# Patient Record
Sex: Female | Born: 1960 | Race: White | Hispanic: No | Smoking: Current every day smoker
Health system: Western US, Academic
[De-identification: ages and names within clinical notes are randomized; demographics above are authoritative.]

## PROBLEM LIST (undated history)

## (undated) DIAGNOSIS — L309 Dermatitis, unspecified: Secondary | ICD-10-CM

## (undated) DIAGNOSIS — J449 Chronic obstructive pulmonary disease, unspecified: Secondary | ICD-10-CM

---
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
## 2012-09-23 ENCOUNTER — Ambulatory Visit: Payer: Self-pay | Admitting: Hematology and Oncology

## 2012-09-23 LAB — CBC CANCER CENTER
Basophil #: 0.1 x10 3/mm (ref 0.0–0.1)
Basophil %: 1.3 %
Eosinophil #: 0.3 x10 3/mm (ref 0.0–0.7)
Eosinophil %: 5.5 %
HCT: 45.2 % (ref 35.0–47.0)
Lymphocyte %: 23.7 %
MCHC: 33.8 g/dL (ref 32.0–36.0)
MCV: 90 fL (ref 80–100)
Monocyte #: 0.4 x10 3/mm (ref 0.2–0.9)
Neutrophil #: 3.9 x10 3/mm (ref 1.4–6.5)
Platelet: 194 x10 3/mm (ref 150–440)
RBC: 5.02 10*6/uL (ref 3.80–5.20)
RDW: 14.2 % (ref 11.5–14.5)
WBC: 6.2 x10 3/mm (ref 3.6–11.0)

## 2012-09-23 LAB — COMPREHENSIVE METABOLIC PANEL
Albumin: 3.8 g/dL (ref 3.4–5.0)
Alkaline Phosphatase: 94 U/L (ref 50–136)
Anion Gap: 8 (ref 7–16)
BUN: 11 mg/dL (ref 7–18)
Chloride: 101 mmol/L (ref 98–107)
Co2: 30 mmol/L (ref 21–32)
Creatinine: 0.99 mg/dL (ref 0.60–1.30)
EGFR (African American): >60
EGFR (Non-African Amer.): >60
Glucose: 93 mg/dL (ref 65–99)
Osmolality: 277 (ref 275–301)
Potassium: 3.6 mmol/L (ref 3.5–5.1)
SGPT (ALT): 53 U/L (ref 12–78)
Sodium: 139 mmol/L (ref 136–145)
Total Protein: 7.1 g/dL (ref 6.4–8.2)

## 2012-10-14 ENCOUNTER — Ambulatory Visit: Payer: Self-pay | Admitting: Hematology and Oncology

## 2013-02-05 ENCOUNTER — Emergency Department: Payer: Self-pay | Admitting: Emergency Medicine

## 2013-02-15 ENCOUNTER — Ambulatory Visit: Payer: Self-pay | Admitting: Gastroenterology

## 2013-02-18 LAB — PATHOLOGY REPORT

## 2013-05-02 ENCOUNTER — Inpatient Hospital Stay: Payer: Self-pay | Admitting: Family Medicine

## 2013-05-02 LAB — CBC
HCT: 40.7 % (ref 35.0–47.0)
HGB: 13.9 g/dL (ref 12.0–16.0)
MCH: 29.9 pg (ref 26.0–34.0)
MCHC: 34.1 g/dL (ref 32.0–36.0)
MCV: 88 fL (ref 80–100)
Platelet: 127 10*3/uL — ABNORMAL LOW (ref 150–440)
RBC: 4.65 10*6/uL (ref 3.80–5.20)
RDW: 13.4 % (ref 11.5–14.5)
WBC: 10.7 10*3/uL (ref 3.6–11.0)

## 2013-05-02 LAB — TROPONIN I: Troponin-I: <0.02 ng/mL

## 2013-05-02 LAB — BASIC METABOLIC PANEL
Anion Gap: 11 (ref 7–16)
BUN: 11 mg/dL (ref 7–18)
CALCIUM: 8.3 mg/dL — AB (ref 8.5–10.1)
CO2: 25 mmol/L (ref 21–32)
Chloride: 92 mmol/L — ABNORMAL LOW (ref 98–107)
Creatinine: 0.94 mg/dL (ref 0.60–1.30)
Glucose: 107 mg/dL — ABNORMAL HIGH (ref 65–99)
Osmolality: 257 (ref 275–301)
POTASSIUM: 2.6 mmol/L — AB (ref 3.5–5.1)
Sodium: 128 mmol/L — ABNORMAL LOW (ref 136–145)

## 2013-05-02 LAB — URINALYSIS, COMPLETE
Bilirubin,UR: NEGATIVE
GLUCOSE, UR: NEGATIVE mg/dL (ref 0–75)
NITRITE: NEGATIVE
Ph: 6 (ref 4.5–8.0)
RBC,UR: 6 /HPF (ref 0–5)
Specific Gravity: 1.012 (ref 1.003–1.030)
Squamous Epithelial: <1
WBC UR: 81 /HPF (ref 0–5)

## 2013-05-02 LAB — RAPID INFLUENZA A&B ANTIGENS

## 2013-05-03 LAB — CBC WITH DIFFERENTIAL/PLATELET
BASOS PCT: 0.2 %
Basophil #: 0 10*3/uL (ref 0.0–0.1)
EOS ABS: 0 10*3/uL (ref 0.0–0.7)
Eosinophil %: 0 %
HCT: 39.5 % (ref 35.0–47.0)
HGB: 13.3 g/dL (ref 12.0–16.0)
Lymphocyte #: 0.3 10*3/uL — ABNORMAL LOW (ref 1.0–3.6)
Lymphocyte %: 3.1 %
MCH: 30 pg (ref 26.0–34.0)
MCHC: 33.8 g/dL (ref 32.0–36.0)
MCV: 89 fL (ref 80–100)
MONO ABS: 0.3 x10 3/mm (ref 0.2–0.9)
Monocyte %: 2.9 %
NEUTROS ABS: 10 10*3/uL — AB (ref 1.4–6.5)
Neutrophil %: 93.8 %
Platelet: 125 10*3/uL — ABNORMAL LOW (ref 150–440)
RBC: 4.45 10*6/uL (ref 3.80–5.20)
RDW: 13.6 % (ref 11.5–14.5)
WBC: 10.6 10*3/uL (ref 3.6–11.0)

## 2013-05-03 LAB — BASIC METABOLIC PANEL
Anion Gap: 6 — ABNORMAL LOW (ref 7–16)
BUN: 10 mg/dL (ref 7–18)
CREATININE: 0.83 mg/dL (ref 0.60–1.30)
Calcium, Total: 8.4 mg/dL — ABNORMAL LOW (ref 8.5–10.1)
Chloride: 103 mmol/L (ref 98–107)
Co2: 27 mmol/L (ref 21–32)
EGFR (African American): >60
EGFR (Non-African Amer.): >60
Glucose: 197 mg/dL — ABNORMAL HIGH (ref 65–99)
OSMOLALITY: 276 (ref 275–301)
Potassium: 3.3 mmol/L — ABNORMAL LOW (ref 3.5–5.1)
Sodium: 136 mmol/L (ref 136–145)

## 2013-05-04 LAB — BASIC METABOLIC PANEL
ANION GAP: 4 — AB (ref 7–16)
BUN: 10 mg/dL (ref 7–18)
CO2: 25 mmol/L (ref 21–32)
Calcium, Total: 8.4 mg/dL — ABNORMAL LOW (ref 8.5–10.1)
Chloride: 110 mmol/L — ABNORMAL HIGH (ref 98–107)
Creatinine: 0.63 mg/dL (ref 0.60–1.30)
EGFR (African American): >60
GLUCOSE: 173 mg/dL — AB (ref 65–99)
Osmolality: 281 (ref 275–301)
POTASSIUM: 4.1 mmol/L (ref 3.5–5.1)
SODIUM: 139 mmol/L (ref 136–145)

## 2013-05-04 LAB — CBC WITH DIFFERENTIAL/PLATELET
Basophil #: 0 10*3/uL (ref 0.0–0.1)
Basophil %: 0.1 %
EOS PCT: 0 %
Eosinophil #: 0 10*3/uL (ref 0.0–0.7)
HCT: 35.8 % (ref 35.0–47.0)
HGB: 12.1 g/dL (ref 12.0–16.0)
LYMPHS PCT: 3.3 %
Lymphocyte #: 0.4 10*3/uL — ABNORMAL LOW (ref 1.0–3.6)
MCH: 30.2 pg (ref 26.0–34.0)
MCHC: 33.9 g/dL (ref 32.0–36.0)
MCV: 89 fL (ref 80–100)
Monocyte #: 0.4 x10 3/mm (ref 0.2–0.9)
Monocyte %: 3.1 %
NEUTROS PCT: 93.5 %
Neutrophil #: 10.9 10*3/uL — ABNORMAL HIGH (ref 1.4–6.5)
PLATELETS: 123 10*3/uL — AB (ref 150–440)
RBC: 4.02 10*6/uL (ref 3.80–5.20)
RDW: 13.7 % (ref 11.5–14.5)
WBC: 11.6 10*3/uL — AB (ref 3.6–11.0)

## 2013-05-05 LAB — URINE CULTURE

## 2013-05-07 LAB — CULTURE, BLOOD (SINGLE)

## 2013-10-28 ENCOUNTER — Emergency Department: Payer: Self-pay | Admitting: Emergency Medicine

## 2013-10-28 LAB — BASIC METABOLIC PANEL
Anion Gap: 7 (ref 7–16)
BUN: 10 mg/dL (ref 7–18)
CALCIUM: 8.5 mg/dL (ref 8.5–10.1)
Chloride: 106 mmol/L (ref 98–107)
Co2: 28 mmol/L (ref 21–32)
Creatinine: 0.82 mg/dL (ref 0.60–1.30)
EGFR (African American): >60
EGFR (Non-African Amer.): >60
GLUCOSE: 84 mg/dL (ref 65–99)
OSMOLALITY: 279 (ref 275–301)
Potassium: 3.6 mmol/L (ref 3.5–5.1)
Sodium: 141 mmol/L (ref 136–145)

## 2013-10-28 LAB — CBC
HCT: 42.9 % (ref 35.0–47.0)
HGB: 14.7 g/dL (ref 12.0–16.0)
MCH: 30.6 pg (ref 26.0–34.0)
MCHC: 34.3 g/dL (ref 32.0–36.0)
MCV: 89 fL (ref 80–100)
PLATELETS: 180 10*3/uL (ref 150–440)
RBC: 4.81 10*6/uL (ref 3.80–5.20)
RDW: 13.8 % (ref 11.5–14.5)
WBC: 4.9 10*3/uL (ref 3.6–11.0)

## 2013-10-28 LAB — URINALYSIS, COMPLETE
BACTERIA: NONE SEEN
Bilirubin,UR: NEGATIVE
Blood: NEGATIVE
Glucose,UR: NEGATIVE mg/dL (ref 0–75)
LEUKOCYTE ESTERASE: NEGATIVE
Nitrite: NEGATIVE
PH: 6 (ref 4.5–8.0)
Protein: NEGATIVE
RBC,UR: 2 /HPF (ref 0–5)
Specific Gravity: 1.008 (ref 1.003–1.030)
Squamous Epithelial: NONE SEEN
WBC UR: <1 /HPF (ref 0–5)

## 2013-10-28 LAB — TROPONIN I: Troponin-I: <0.02 ng/mL

## 2014-05-07 NOTE — H&P (Signed)
PATIENT NAME:  Toni Woods, Toni Woods MR#:  [id number] DATE OF BIRTH:  Aug 27, 1960  DATE OF ADMISSION:  05/02/2013  PRIMARY CARE PHYSICIAN: Nonlocal.  REFERRING PHYSICIAN: Dr. Governor Rooksebecca Lord.   CHIEF COMPLAINT: Cough, shortness of breath.   HISTORY OF PRESENT ILLNESS: The patient is a 54 year old female with history of continued tobacco use, hypertension, hyperlipidemia, presented to the Emergency Department with complaints of cough, shortness of breath for the last two days. Also noted to have a fever with chills. Concerning this, came to the Emergency Department. Work-up in the Emergency Department with CTA does not show very little airway thickening. The patient was also tachycardic. Concerning this, the decision is made to admit the patient for close monitoring.   PAST MEDICAL HISTORY: 1.  Anxiety. 2.  Hypertension.  3.  Chronic headaches.  4.  Polycythemia.  5.  Left renal artery stenosis.  6.  Left rotator cuff tear.  7.  Tubal ligation.    ALLERGIES: ANAPROX.  HOME MEDICATIONS: 1.  Trazodone 100 mg once a day.  2.  Simvastatin 20 mg once a day.  3.  Robaxin 500 mg 4 times a day.  4.  Prednisone 60 mg daily.  5.  Imdur 1 tablet once a day.  6.  Hydrochlorothiazide 25 mg once a day.  7.  Fluoxetine 20 mg once a day.  8.  Clonazepam 0.5 mg 3 times a day.  9.  Bupropion 10 mg 3 times a day.  10.  Aspirin 81 mg once a day.    SOCIAL HISTORY: Continues to smoke 1/2 pack to 1 pack a day. Denies drinking alcohol or using illicit drugs. 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   FAMILY HISTORY: Heart problems.   REVIEW OF SYSTEMS: CONSTITUTIONAL: Experiencing generalized weakness.  EYES: No change in vision.  ENT: No change in hearing.  RESPIRATORY: Has cough, shortness of breath.  GASTROINTESTINAL: No nausea, vomiting, abdominal pain.  GENITOURINARY: No dysuria or hematuria.  SKIN: No rash or lesions.  MUSCULOSKELETAL: No joint pains and aches.  NEUROLOGIC: No weakness or numbness in any part of the body.    PHYSICAL EXAMINATION: GENERAL: This patient is a 54 year old female who comes to the Emergency Department with cough, shortness of breath and fever, and is also found to have electrolyte imbalances of low potassium and low sodium.   ASSESSMENT AND PLAN:  1.  Chronic obstructive pulmonary disease exacerbation. Continue the breathing treatments, Solu-Medrol.  2.  Bronchitis. Keep the patient on antibiotics with Rocephin and Zithromax.  3.  Hyponatremia secondary to underlying acute lung disease, as well as dehydration. Continue with IV fluids and treat pneumonia.  4.  Hypokalemia: Will replace by mouth.  5.  Continued tobacco use. Counseled with patient for five minutes; expressed understanding.  6.  Hypertension, currently well-controlled. Continue with hydrochlorothiazide, Imdur. 7.  Keep the patient on deep vein thrombosis prophylaxis with Lovenox.   TIME SPENT: 50 minutes.    ____________________________ Susa GriffinsPadmaja Avry Monteleone, MD pv:cg D: 05/02/2013 23:05:00 ET T: 05/02/2013 23:32:59 ET JOB#: [id number]  cc: Susa GriffinsPadmaja Drisana Schweickert, MD, <Dictator> Susa GriffinsPADMAJA Athanasia Stanwood MD ELECTRONICALLY SIGNED 05/13/2013 20:56

## 2014-05-07 NOTE — Discharge Summary (Signed)
PATIENT NAME:  Toni Woods, [name] MR#:  [id number] DATE OF BIRTH:  19-Oct-1960  DATE OF ADMISSION:  05/02/2013 DATE OF DISCHARGE:  05/04/2013  CHIEF COMPLAINT: Shortness of breath.   HOSPITAL COURSE: The patient is a 54 year old female with history of tobacco abuse, hypertension, hyperlipidemia, anxiety. He presented to the Emergency Department with cough, hyperlipidemia. The patient was evaluated in the Emergency Department.  The patient had a CT angio that did not show any significant pulmonary embolism. It showed bronchitis. As far as her problems, the patient had COPD, had an exacerbation at this moment.  The patient was put on steroids and antibiotics, and improved in a couple of days. The patient has hypertension, which was well controlled during this hospitalization. Severe hypokalemia, this is likely due to her dietary status. The patient is doing much better after her airway has been cleared.   She is going to be discharged with the following medications:  Hydrochlorothiazide 25 mg once daily, trazodone 100 mg once daily, isosorbide mononitrate 30 mg once daily, buspirone 10 mg 3 times daily, clonazepam 0.5 mg 2 times daily, aspirin 81 mg daily, Robaxin 500 mg 4 times a day as needed for shortness of breath, fluoxetine 40 mg once a day, prednisone taper starting at 50 mg decreased by 1omg  every day until gone, Augmentin 875 mg/125 mg twice daily for 8 days, NicoDerm patch as needed for smoking cessation.   Follow up with primary care physician in the next 1 to 2 weeks.   HOSPITAL COURSE: A very nice 54 year old female with history of ongoing smoking, presents to the Emergency Department on 05/02/2013 due to acute shortness of breath for 2 days.  There were concerns on the Emergency Department as far as the possibility of pulmonary embolus, but her CT angio showed no pulmonary embolus. As far as her SOB  goes, the patient had chronic obstructive pulmonary disease with exacerbation/ pneumonia. The  patient treated with Solu-Medrol, antibiotics. Monitor As far as her hypertension, seems to be stable on her home medications.  As far as her hyponatremia, seems to be stable due to intravascular volume depletion, now resolved.    Continued tobacco abuse. The patient has been giving nicotine patches I spent about 40 minutes with this patient on discharging.   ____________________________ Felipa Furnaceoberto Sanchez Gutierrez, MD rsg:sg D: 05/09/2013 00:21:25 ET T: 05/09/2013 12:11:40 ET JOB#: [id number]  cc: Felipa Furnaceoberto Sanchez Gutierrez, MD, <Dictator> Ailey Wessling Juanda ChanceSANCHEZ GUTIERRE MD ELECTRONICALLY SIGNED 05/18/2013 23:05

## 2014-07-21 IMAGING — CT CT ANGIO CHEST
2 of 6 series · 18 of 36 positions shown · IV contrast (APPLIED)
Comparison: DG CHEST 2V dated 05/02/2013

CLINICAL DATA: Shortness of breath.  Cough.  Fever.

EXAM:
CT ANGIOGRAPHY CHEST WITH CONTRAST
TECHNIQUE: Multidetector CT imaging of the chest was performed using the
standard protocol during bolus administration of intravenous
contrast. Multiplanar CT image reconstructions and MIPs were
obtained to evaluate the vascular anatomy.
CONTRAST:  75 cc Isovue 370

[Series 5: pe 1.0 thins · axial · 0.67mm/px · z∈[-290,-45]mm · 17 of 277 slices shown]
[im 16/277  lung]
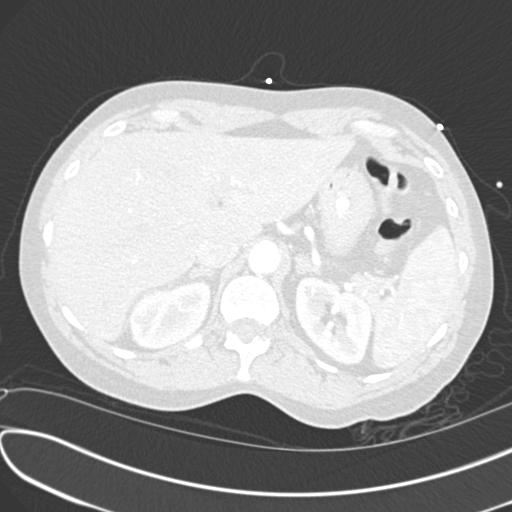
[im 31/277  mediastinal]
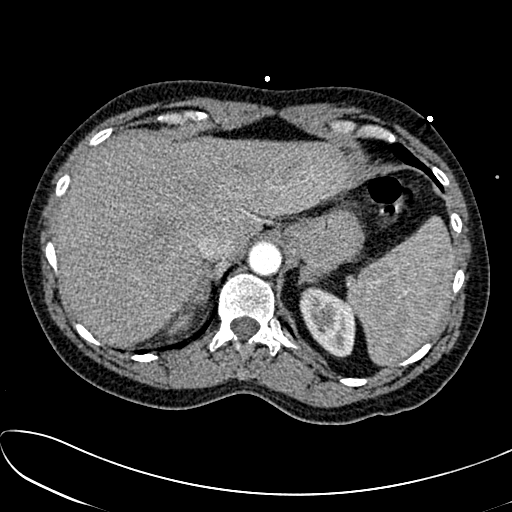
[im 47/277  lung]
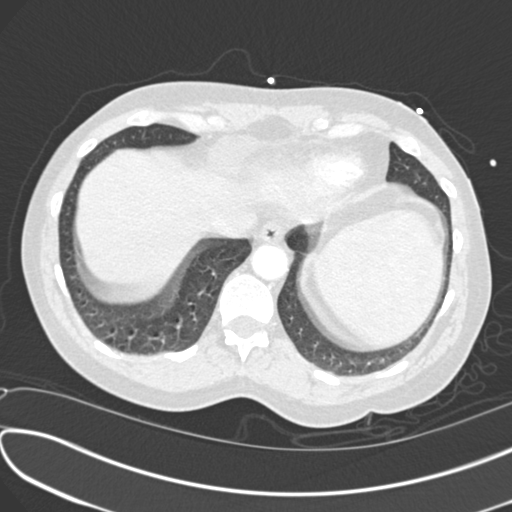
[im 62/277  mediastinal]
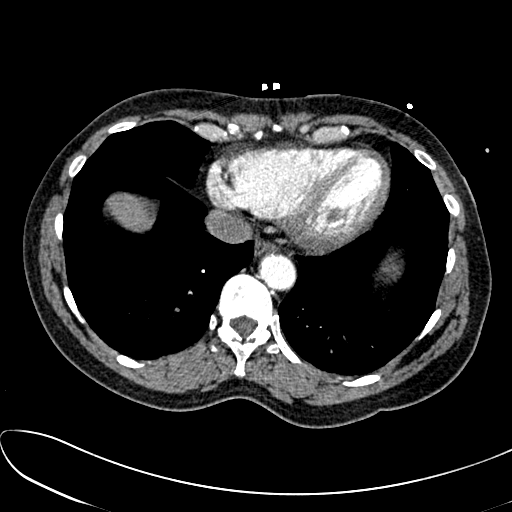
[im 77/277  lung]
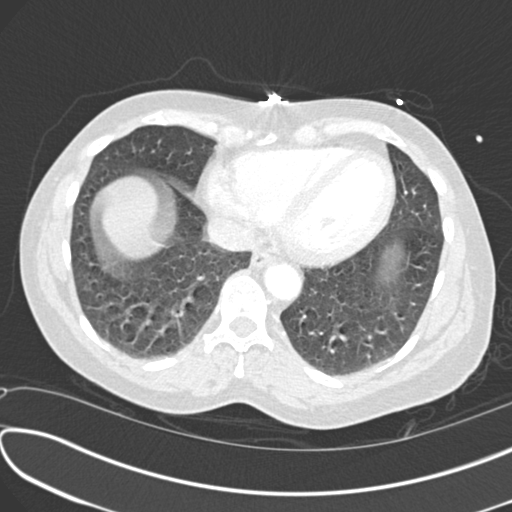
[im 93/277  mediastinal]
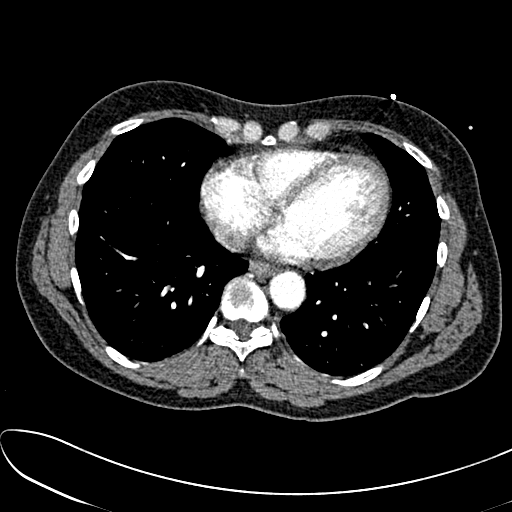
[im 108/277  lung]
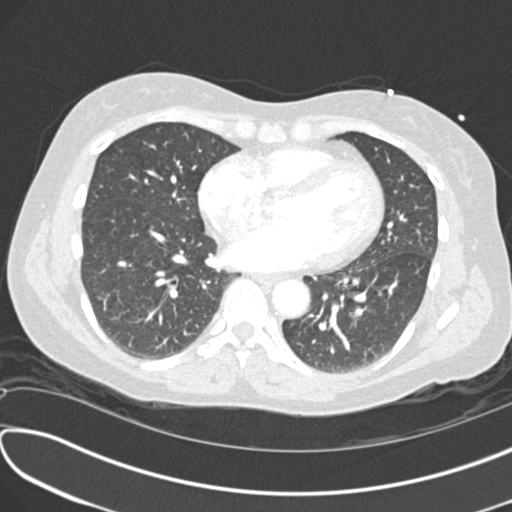
[im 123/277  mediastinal]
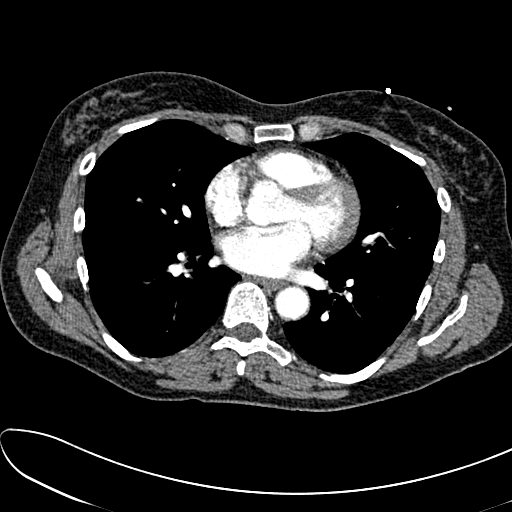
[im 139/277  lung]
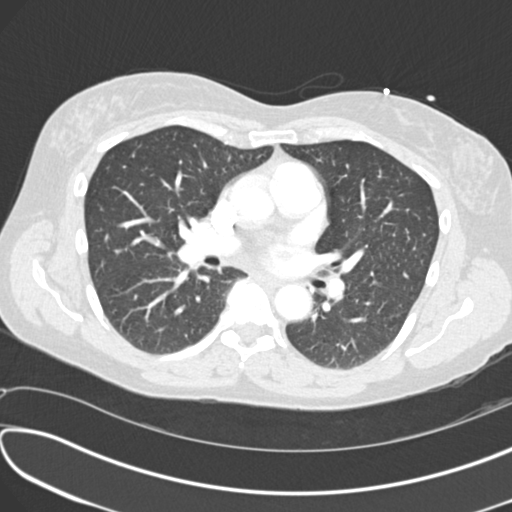
[im 154/277  mediastinal]
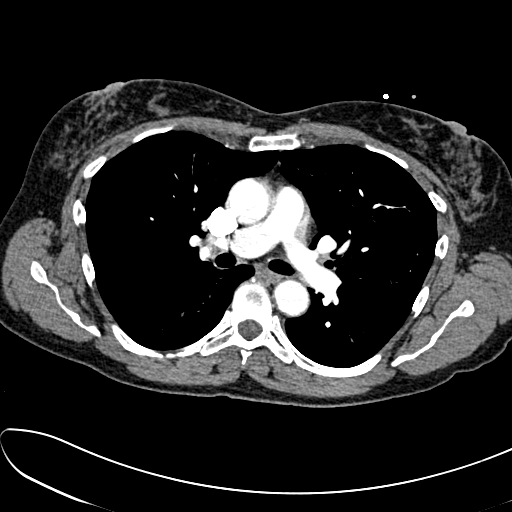
[im 169/277  lung]
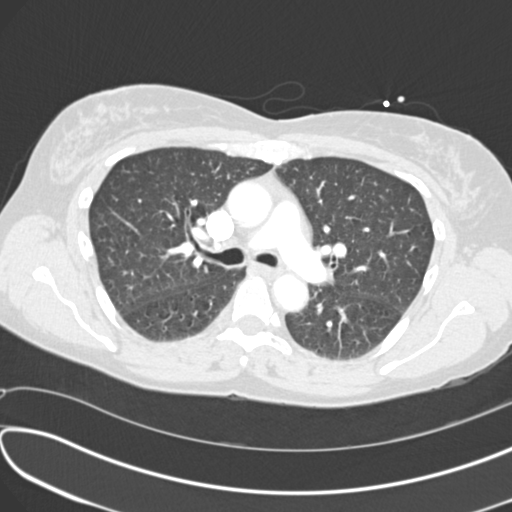
[im 185/277  mediastinal]
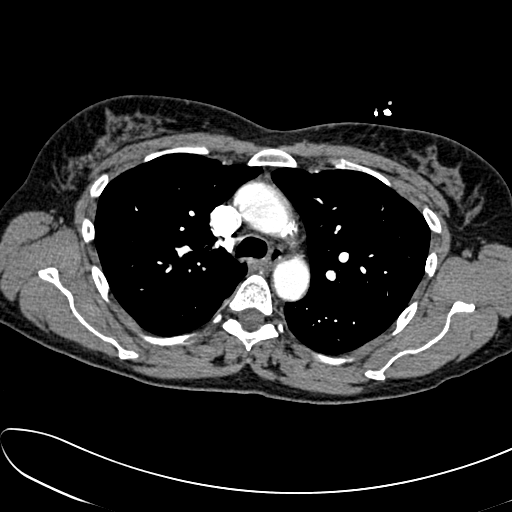
[im 200/277  lung]
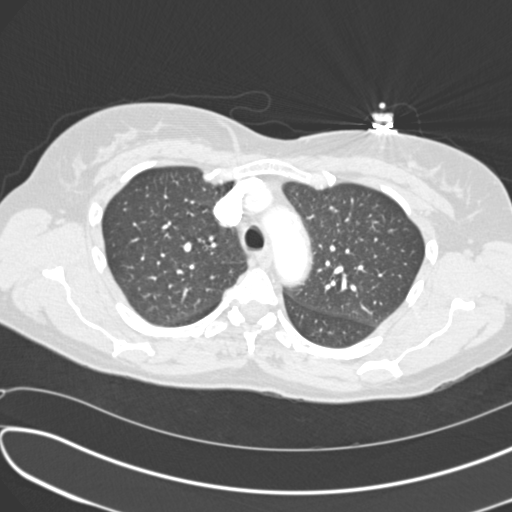
[im 215/277  mediastinal]
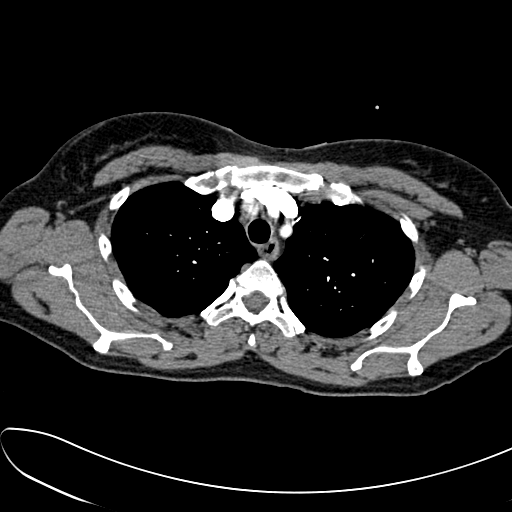
[im 231/277  lung]
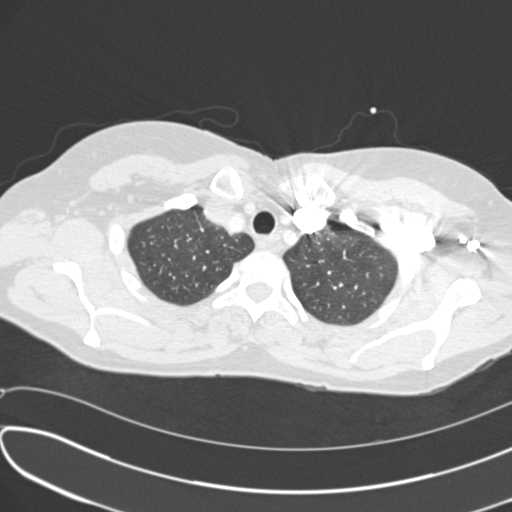
[im 246/277  mediastinal]
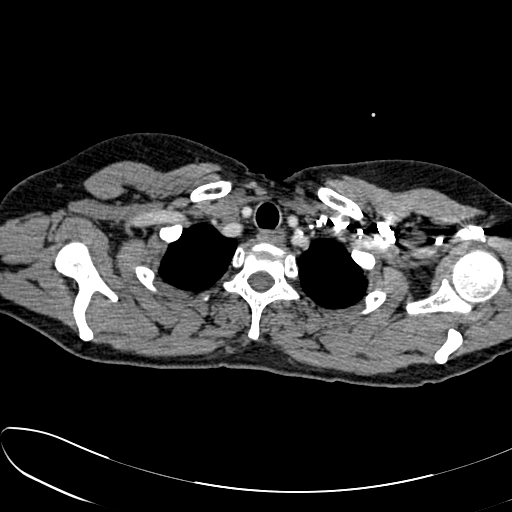
[im 261/277  lung]
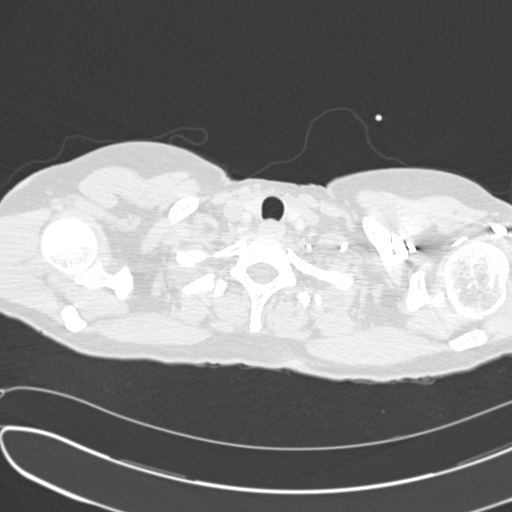

[Series 7: cor pe 2.0 mpr · coronal · 0.53mm/px · 1 of 102 slices shown]
[im 51/102  mediastinal]
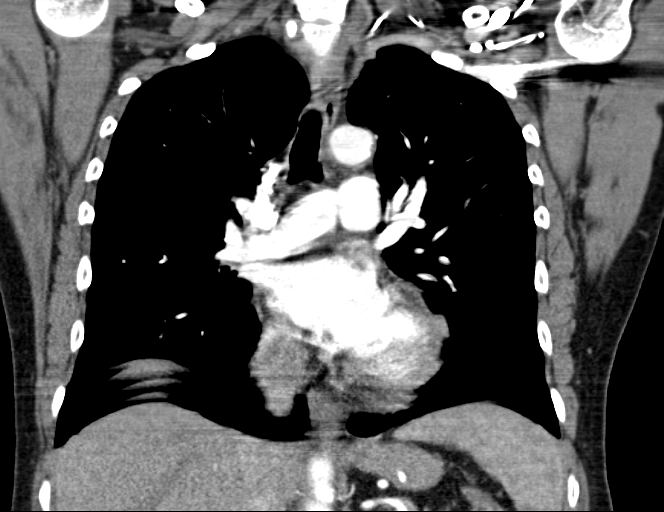

[18 of 36 positions shown; findings below may reference images not displayed]

FINDINGS: No filling defect is identified in the pulmonary arterial tree to
suggest pulmonary embolus. No acute aortic findings. No pathologic
thoracic adenopathy.

Fullness of both adrenal glands without overt mass visualized.

Mild bilateral airway thickening.

Review of the MIP images confirms the above findings.
IMPRESSION: 1. Bilateral airway thickening suggesting bronchitis or reactive
airways disease. No embolus identified.

## 2015-01-16 IMAGING — CR DG CHEST 2V
1 series · 2 of 2 positions shown · non-contrast
Comparison: CT of the chest May 02, 2013 and chest radiograph
May 02, 2013

CLINICAL DATA: RIGHT chest pain, history of COPD and smoking, heart
murmur. No additional acute clinical information provided.

EXAM:
CHEST  2 VIEW

[Series 1: dxr chest pa (or ap) and lateral · 0.14mm/px · 2 of 2 slices shown]
[im 1/2]
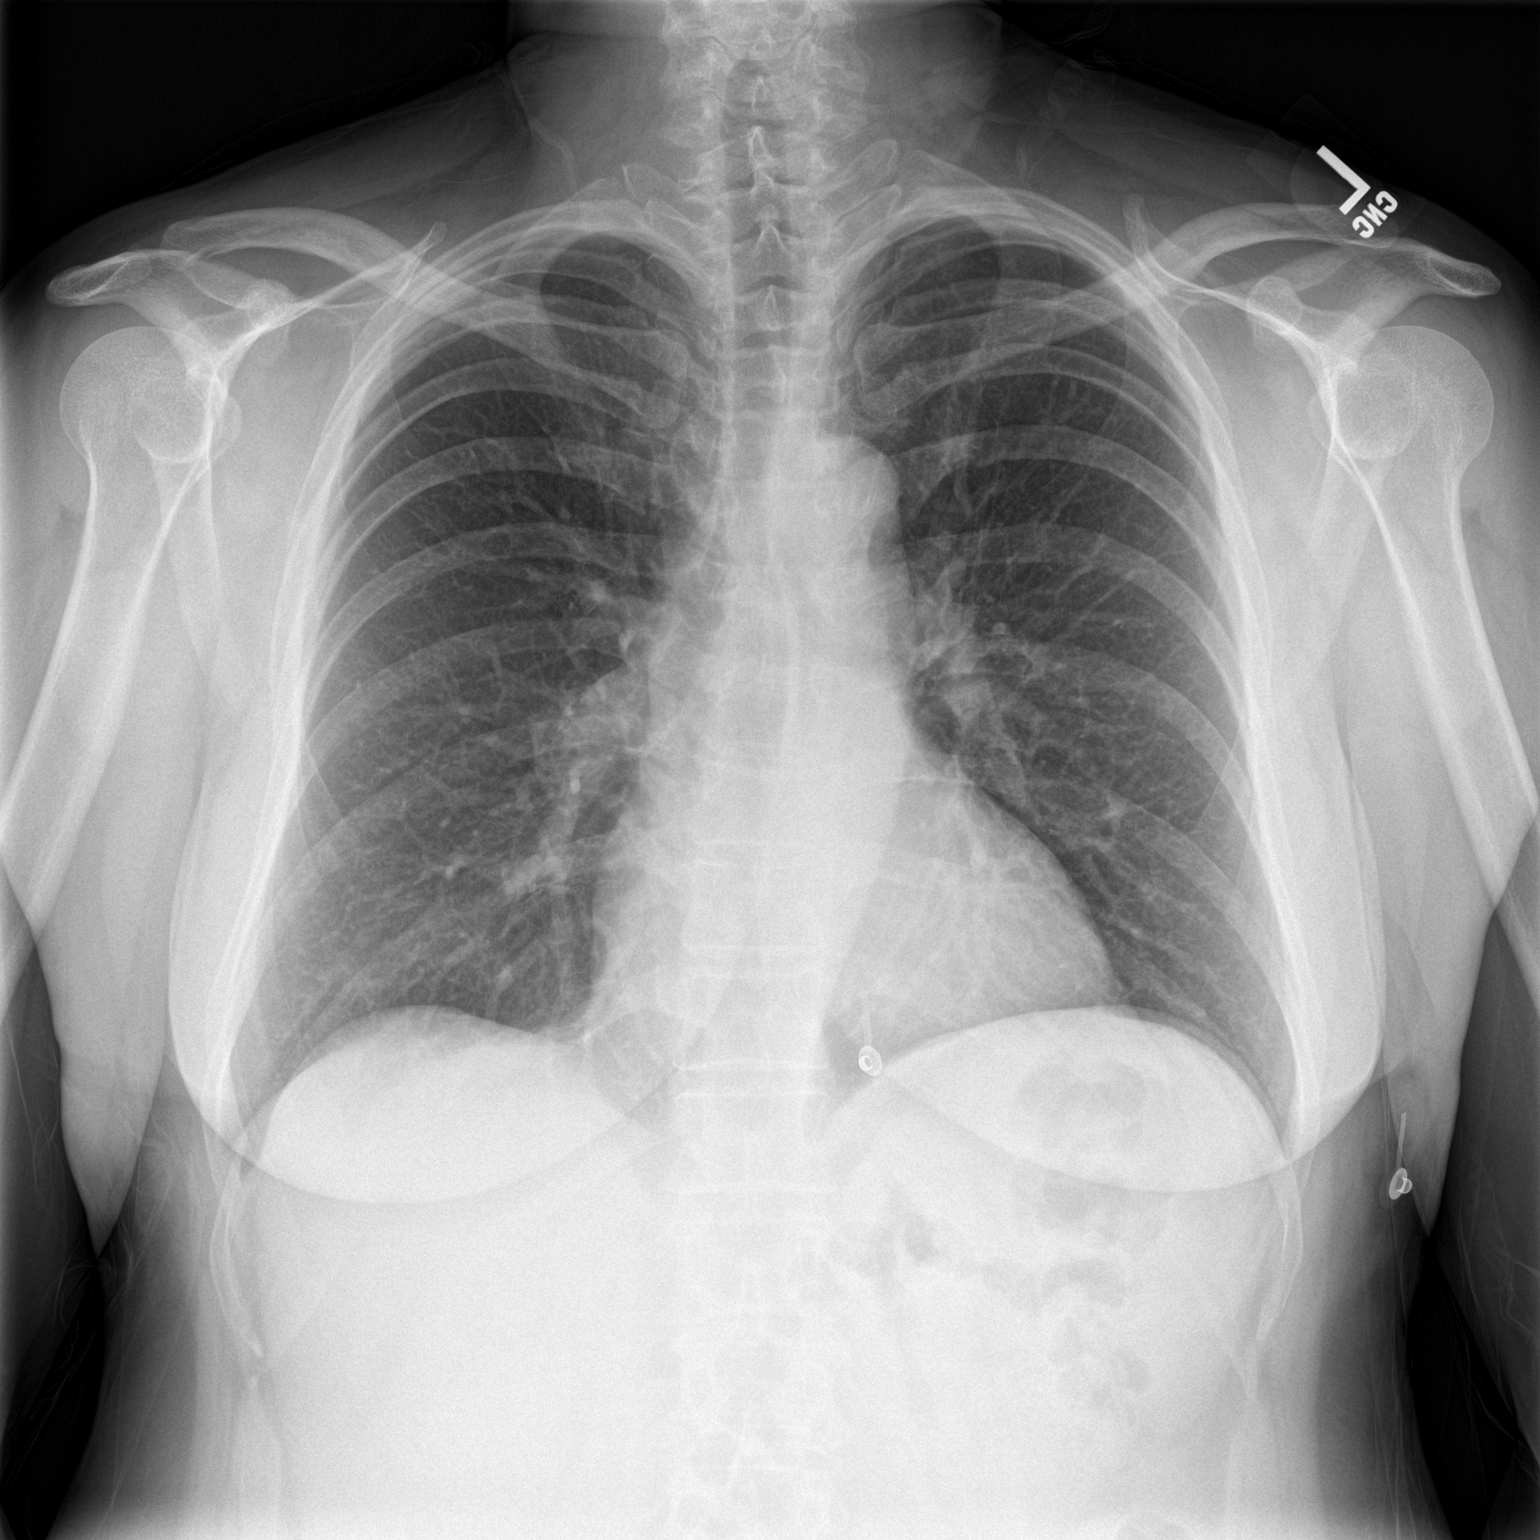
[im 2/2]
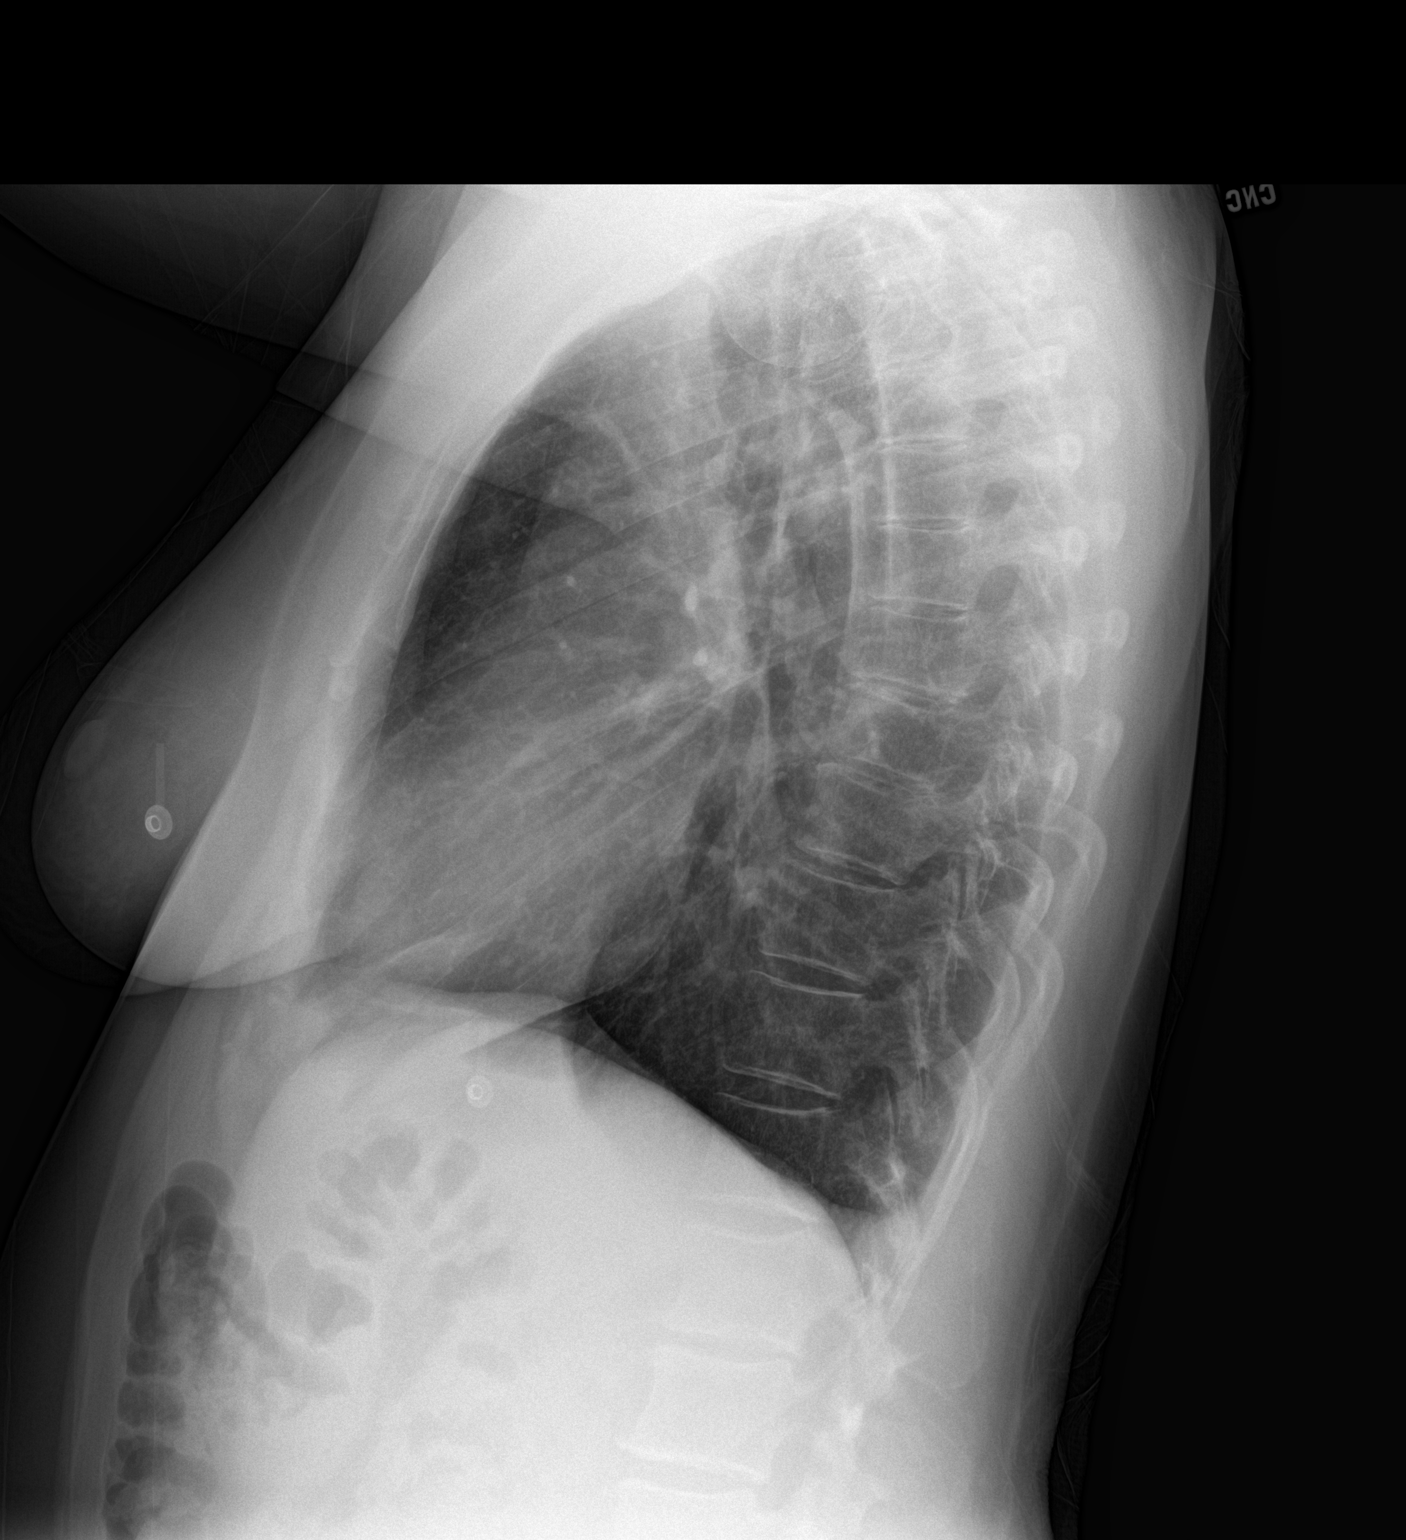

[2 of 2 positions shown; findings below may reference images not displayed]

FINDINGS: Cardiomediastinal silhouette is unremarkable. The lungs are clear
without pleural effusions or focal consolidations. Trachea projects
midline and there is no pneumothorax. Soft tissue planes and
included osseous structures are non-suspicious.
IMPRESSION: No active cardiopulmonary disease.

  By: Myrlande Krueger

## 2016-10-25 ENCOUNTER — Inpatient Hospital Stay
Admission: EM | Admit: 2016-10-25 | Discharge: 2016-10-26 | DRG: 203 | Disposition: A | Discharge status: Home / Self Care | Payer: MEDICAID | Attending: Internal Medicine | Admitting: Internal Medicine

## 2016-10-25 ENCOUNTER — Other Ambulatory Visit: Payer: MEDICAID

## 2016-10-25 ENCOUNTER — Emergency Department: Payer: MEDICAID

## 2016-10-25 DIAGNOSIS — E785 Hyperlipidemia, unspecified: Secondary | ICD-10-CM

## 2016-10-25 DIAGNOSIS — R079 Chest pain, unspecified: Secondary | ICD-10-CM

## 2016-10-25 DIAGNOSIS — I701 Atherosclerosis of renal artery: Secondary | ICD-10-CM

## 2016-10-25 DIAGNOSIS — I10 Essential (primary) hypertension: Secondary | ICD-10-CM

## 2016-10-25 DIAGNOSIS — F17210 Nicotine dependence, cigarettes, uncomplicated: Secondary | ICD-10-CM

## 2016-10-25 DIAGNOSIS — R0789 Other chest pain: Principal | ICD-10-CM

## 2016-10-25 HISTORY — DX: Chronic obstructive pulmonary disease, unspecified: J44.9

## 2016-10-25 HISTORY — DX: Dermatitis, unspecified: L30.9

## 2016-10-25 LAB — COMPREHENSIVE METABOLIC PANEL
ALANINE TRANSFERASE (ALT): 36 U/L (ref 4–56)
ALB/GLOB RATIO_MMC: 1.3 (ref 1.0–1.6)
ALBUMIN: 4.2 g/dL (ref 3.2–4.7)
ALKALINE PHOSPHATASE (ALP): 83 U/L (ref 38–126)
ASPARTATE TRANSAMINASE (AST): 27 U/L (ref 9–44)
BILIRUBIN TOTAL: 0.7 mg/dL (ref 0.1–2.2)
BUN/CREATININE_MMC: 14.8 (ref 7.3–21.7)
CALCIUM: 9.3 mg/dL (ref 8.7–10.2)
CARBON DIOXIDE TOTAL: 29 mmol/L (ref 22–32)
CHLORIDE: 104 mmol/L (ref 99–109)
CREATININE BLOOD: 0.81 mg/dL (ref 0.50–1.30)
E-GFR, NON-AFRICAN AMERICAN: 73 mL/min/{1.73_m2} (ref 60–?)
E-GFR_MMC: 73 mL/min/{1.73_m2} (ref 60–?)
GLOBULIN BLOOD_MMC: 3.3 g/dL (ref 2.2–4.2)
GLUCOSE: 99 mg/dL (ref 70–99)
POTASSIUM: 4.1 mmol/L (ref 3.5–5.2)
PROTEIN: 7.5 g/dL (ref 5.9–8.2)
SODIUM: 139 mmol/L (ref 134–143)
UREA NITROGEN, BLOOD (BUN): 12 mg/dL (ref 6–21)

## 2016-10-25 LAB — CK-MB PANEL (CK-ISO)
CK-MB_MMC: 1.6 ng/mL (ref <=4.0)
CK-MB_MMC: 1.4 ng/mL (ref <=4.0)
CREATINE KINASE, TOTAL_MMC: 75 U/L (ref 21–215)
CREATINE KINASE, TOTAL_MMC: 64 U/L (ref 21–215)
RELATIVE INDEX: 2.1 (ref 0.0–2.5)
RELATIVE INDEX: 2.2 (ref 0.0–2.5)

## 2016-10-25 LAB — CBC WITH DIFFERENTIAL
BASOPHILS % AUTO: 1 % (ref 0.0–1.0)
BASOPHILS ABS AUTO: 0.1 10*3/uL (ref 0.0–0.1)
EOSINOPHIL % AUTO: 5.4 % — AB (ref 0.0–4.0)
EOSINOPHIL ABS AUTO: 0.3 10*3/uL — AB (ref 0.0–0.2)
HEMATOCRIT: 42.1 % (ref 36.0–48.0)
HEMOGLOBIN: 14.5 g/dL (ref 12.0–16.0)
IMMATURE GRANULOCYTES % AUTO: 0.3 % (ref 0.00–0.50)
IMMATURE GRANULOCYTES ABS AUTO: 0 10*3/uL (ref 0.0–0.0)
LYMPHOCYTE ABS AUTO: 1.9 10*3/uL (ref 1.3–2.9)
LYMPHOCYTES % AUTO: 31 % (ref 5.0–41.0)
MCH: 30.2 pg (ref 27.0–34.0)
MCHC G/DL: 34.4 g/dL (ref 33.0–37.0)
MCV: 87.7 fL (ref 82.0–97.0)
MONOCYTES % AUTO: 6.2 % (ref 0.0–10.0)
MONOCYTES ABS AUTO: 0.4 10*3/uL (ref 0.3–0.8)
MPV: 10 fL (ref 9.4–12.4)
NEUTROPHIL ABS AUTO: 3.51 10*3/uL (ref 2.20–4.80)
NEUTROPHILS % AUTO: 56.1 % (ref 45.0–75.0)
NUCLEATED CELL COUNT: 0 10*3/uL (ref 0.0–0.1)
NUCLEATED RBC/100 WBC: 0 %{WBCs} (ref <=0.0)
PLATELET COUNT: 224 10*3/uL (ref 151–365)
RDW: 13.2 % (ref 11.5–14.5)
RED CELL COUNT: 4.8 10*6/uL (ref 3.80–5.10)
WHITE BLOOD CELL COUNT: 6.3 10*3/uL (ref 4.2–10.8)

## 2016-10-25 LAB — TROPONIN I

## 2016-10-25 LAB — TSH WITH FREE T4 REFLEX
THYROID STIMULATING HORMONE: 1.74 u[IU]/mL (ref 0.45–5.33)
THYROXINE, FREE (FREE T4): 1 ng/dL (ref 0.6–1.6)

## 2016-10-25 LAB — ELECTROCARDIOGRAM WITH RHYTHM STRIP: QTC: 442 ms

## 2016-10-25 MED ORDER — MORPHINE 4 MG/ML INTRAVENOUS CARTRIDGE
3.00 mg | CARTRIDGE | INTRAVENOUS | Status: DC | PRN
Start: 2016-10-25 — End: 2016-10-25

## 2016-10-25 MED ORDER — ALUMINUM-MAG HYDROXIDE-SIMETHICONE 200 MG-200 MG-20 MG/5 ML ORAL SUSP
30.00 mL | Freq: Once | ORAL | Status: AC
Start: 2016-10-25 — End: 2016-10-25
  Administered 2016-10-25: 30 mL via ORAL
  Filled 2016-10-25: qty 30

## 2016-10-25 MED ORDER — LIDOCAINE HCL 2 % MUCOSAL SOLUTION
10.00 mL | Freq: Once | Status: AC
Start: 2016-10-25 — End: 2016-10-25
  Administered 2016-10-25: 10 mL via ORAL
  Filled 2016-10-25: qty 15

## 2016-10-25 MED ORDER — NITROGLYCERIN 0.4 MG SUBLINGUAL TABLET
0.40 mg | SUBLINGUAL_TABLET | SUBLINGUAL | Status: DC
Start: 2016-10-25 — End: 2016-10-26

## 2016-10-25 MED ORDER — NITROGLYCERIN 0.4 MG SUBLINGUAL TABLET
0.40 mg | SUBLINGUAL_TABLET | SUBLINGUAL | Status: DC | PRN
Start: 2016-10-25 — End: 2016-10-26

## 2016-10-25 MED ORDER — BISACODYL 10 MG RECTAL SUPPOSITORY
10.00 mg | RECTAL | Status: DC | PRN
Start: 2016-10-25 — End: 2016-10-26

## 2016-10-25 MED ORDER — MELATONIN 3 MG TABLET
3.00 mg | ORAL_TABLET | Freq: Every day | ORAL | Status: DC | PRN
Start: 2016-10-25 — End: 2016-10-26

## 2016-10-25 MED ORDER — HYDROCODONE 5 MG-ACETAMINOPHEN 325 MG TABLET
1.00 | ORAL_TABLET | ORAL | Status: DC | PRN
Start: 2016-10-25 — End: 2016-10-26
  Administered 2016-10-25 – 2016-10-26 (×2): 1 via ORAL
  Filled 2016-10-25 (×2): qty 1

## 2016-10-25 MED ORDER — ASPIRIN 81 MG TABLET,DELAYED RELEASE
81.00 mg | DELAYED_RELEASE_TABLET | Freq: Every day | ORAL | Status: DC
Start: 2016-10-26 — End: 2016-10-26
  Administered 2016-10-26: 81 mg via ORAL
  Filled 2016-10-25: qty 1

## 2016-10-25 MED ORDER — DOCUSATE CALCIUM 240 MG CAPSULE
240.00 mg | ORAL_CAPSULE | Freq: Every day | ORAL | Status: DC
Start: 2016-10-25 — End: 2016-10-26

## 2016-10-25 MED ORDER — PNEUMOCOCCAL 23 POLYVALENT VACCINE 25 MCG/0.5 ML INJECTION SYRINGE
0.50 mL | INJECTION | Freq: Once | INTRAMUSCULAR | Status: AC
Start: 2016-10-25 — End: 2016-10-25
  Administered 2016-10-25: 0.5 mL via INTRAMUSCULAR
  Filled 2016-10-25: qty 0.5

## 2016-10-25 MED ORDER — MAGNESIUM HYDROXIDE 400 MG/5 ML ORAL SUSPENSION
30.00 mL | ORAL | Status: DC | PRN
Start: 2016-10-25 — End: 2016-10-26

## 2016-10-25 MED ORDER — LIDOCAINE HCL 10 MG/ML (1 %) INJECTION SOLUTION
0.20 mL | INTRAMUSCULAR | Status: DC | PRN
Start: 2016-10-25 — End: 2016-10-26
  Filled 2016-10-25: qty 10

## 2016-10-25 MED ORDER — SODIUM CHLORIDE 0.9 % (FLUSH) INJECTION SYRINGE
2.50 mL | INJECTION | INTRAMUSCULAR | Status: DC | PRN
Start: 2016-10-25 — End: 2016-10-26

## 2016-10-25 MED ORDER — ACETAMINOPHEN 325 MG TABLET
325.00 mg | ORAL_TABLET | ORAL | Status: DC | PRN
Start: 2016-10-25 — End: 2016-10-25

## 2016-10-25 MED ORDER — SODIUM CHLORIDE 0.9 % (FLUSH) INJECTION SYRINGE
2.50 mL | INJECTION | Freq: Three times a day (TID) | INTRAMUSCULAR | Status: DC
Start: 2016-10-25 — End: 2016-10-26
  Administered 2016-10-25 – 2016-10-26 (×4): 2.5 mL via INTRAVENOUS

## 2016-10-25 MED ORDER — SODIUM CHLORIDE 0.9 % (FLUSH) INJECTION SYRINGE
2.50 mL | INJECTION | INTRAMUSCULAR | Status: DC | PRN
Start: 2016-10-25 — End: 2016-10-25

## 2016-10-25 MED ORDER — NALOXONE 0.4 MG/ML INJECTION SOLUTION
0.08 mg | INTRAMUSCULAR | Status: DC | PRN
Start: 2016-10-25 — End: 2016-10-26

## 2016-10-25 MED ORDER — NALOXONE 0.4 MG/ML INJECTION SOLUTION
0.20 mg | INTRAMUSCULAR | Status: DC | PRN
Start: 2016-10-25 — End: 2016-10-26

## 2016-10-25 MED ORDER — MORPHINE 4 MG/ML INTRAVENOUS CARTRIDGE
3.00 mg | CARTRIDGE | INTRAVENOUS | Status: DC | PRN
Start: 2016-10-25 — End: 2016-10-26

## 2016-10-25 MED ORDER — FLU VACCINE QS 2018-19(6 MOS UP)(PF)60 MCG(15 MCGX4)/0.5 ML IM SYRINGE
0.50 mL | INJECTION | INTRAMUSCULAR | Status: AC
Start: 2016-10-25 — End: 2016-10-26
  Administered 2016-10-26: 0.5 mL via INTRAMUSCULAR
  Filled 2016-10-25: qty 0.5

## 2016-10-25 MED ORDER — PROMETHAZINE 25 MG/ML INJECTION SOLUTION
6.25 mg | INTRAMUSCULAR | Status: DC | PRN
Start: 2016-10-25 — End: 2016-10-26

## 2016-10-25 MED ORDER — PROMETHAZINE 25 MG/ML INJECTION SOLUTION
6.25 mg | INTRAMUSCULAR | Status: DC | PRN
Start: 2016-10-25 — End: 2016-10-25

## 2016-10-25 MED ORDER — ASPIRIN 81 MG CHEWABLE TABLET
162.00 mg | CHEWABLE_TABLET | Freq: Once | ORAL | Status: AC
Start: 2016-10-25 — End: 2016-10-25
  Administered 2016-10-25: 162 mg via ORAL
  Filled 2016-10-25: qty 2

## 2016-10-25 MED ORDER — ONDANSETRON HCL (PF) 4 MG/2 ML INJECTION SOLUTION
4.00 mg | Freq: Three times a day (TID) | INTRAMUSCULAR | Status: DC | PRN
Start: 2016-10-25 — End: 2016-10-26

## 2016-10-25 MED ORDER — SODIUM CHLORIDE 0.9 % (FLUSH) INJECTION SYRINGE
2.50 mL | INJECTION | Freq: Three times a day (TID) | INTRAMUSCULAR | Status: DC
Start: 2016-10-25 — End: 2016-10-25

## 2016-10-25 MED ORDER — HYDROCODONE 5 MG-ACETAMINOPHEN 325 MG TABLET
1.00 | ORAL_TABLET | ORAL | Status: DC | PRN
Start: 2016-10-25 — End: 2016-10-25

## 2016-10-25 MED ORDER — ACETAMINOPHEN 325 MG TABLET
325.00 mg | ORAL_TABLET | ORAL | Status: DC | PRN
Start: 2016-10-25 — End: 2016-10-26
  Administered 2016-10-25: 325 mg via ORAL
  Filled 2016-10-25: qty 1

## 2016-10-25 NOTE — ED Nursing Note (Signed)
Pt talking on phone in room, NAD.

## 2016-10-25 NOTE — ED Nursing Note (Signed)
Iv attempt LAC, got blood but iv no good.  unsuccesful

## 2016-10-25 NOTE — ED Triage Note (Signed)
Chest pain on and off for the past few weeks. Pain is increased today and is radiating to the left side. Describes as heaviness

## 2016-10-25 NOTE — Nurse Discharge Note (Signed)
Gave printout on stress test and echo.

## 2016-10-25 NOTE — ED Nursing Note (Signed)
Hospitalist bedside 

## 2016-10-25 NOTE — Nurse Focus (Signed)
End of Shift Statement      Patient Progress per diagnosis: A&Ox4. Pt reports slight pain in mid/left anterior chest 2/10 or less. Pt tolerating diet. Has been up walking the hallway and visiting her friend that is in 2042. No complaints. Pt given education on echo and stress test that will be done tomorrow. NSR    Significant events: none    Patients pain level assessed during shift and interventions for increased comfort were implemented as needed.   Interventions were effective  Patient was able to participate in ADL's , rest comfortably and increase mobility  Side Effects: none  Education provided on procedure stress test and echo    Unless otherwise noted, the patient's status remained unchanged during shift. All planned interventions and routine care were completed during the shift, and the patient/family received teaching in the language preferred with sufficient comprehension.  )    Treasa School, RN

## 2016-10-25 NOTE — H&P (Signed)
HISTORY & PHYSICAL  Service:  [facility] Hospitalist  Date of Admission:   10/25/2016 12:05 PM Patient's PCP: Celso Amy Moizeau   Note Started: 10/25/2016, 14:58 Date of Service:   10/25/16           CHIEF COMPLAINT  Chest pain    HISTORY OF PRESENT ILLNESS    Alice Baker is a pleasant 56 year old lady comes to the emergency room with complaining of chest pain.  Her symptoms started about a month ago.  The pain is located in the mid anterior sternal area.  This is intermittent in nature.  There is no relieving or exacerbating factor.  Then she went to the walk-in clinic and then follow-up with her primary care doctor.  Patient was thought to have a costochondritis and was given ibuprofen 800 mg which she took 3-4 times a day for the last 10 days.  Patient got somewhat better but continues to have ongoing intermittent chest pain.  Therefore patient was advised to check cardiac enzymes.  She came to the outpatient laboratory for CPK and troponin check and then she was advised to go to the emergency room.    Here in the emergency room EKG shows normal sinus rhythm no acute ischemic changes.  Blood pressure remained stable and patient is pain-free.  Patient does not have any diaphoretic spell, calf pain, pleuritic pain, fever, chills, nausea or vomiting, abdominal pain, skin rash or joint pain.  She is tolerating diet well and no symptoms suggestive of gallstones.  No fall trauma or injury.    The pain she refers a slightly got worse for the last 2 days and was radiating to the left chest and sometimes left arm.  Patient risk factor includes smoking.  Patient has cardiac catheterizations about 7 years ago.  She also has a risk factor in terms of hypertension and hyperlipidemia.  She was told to have renal artery stenosis as well.      ROS    Described in HPI, and rest are negative    Past Medical History:     1.  Hypertension  2.  Hyperlipidemia  3.  Left renal artery stenosis  4.  Active smoking      Past Surgical  History:    1.   Cardiac catheterization 7 years ago  2.  Left shoulder rotator cuff repair  3.  Right elbow tendon repair  4.  Laparoscopic cervical cyst excision in the past      Allergies:      Anaprox [Naproxen Sodium]    Chest Pain    Family History:    No premature artery disease, mother had multiple strokes    Social History:    Patient lives with a roommate, she is retired, smokes 1-3 cigarettes/day, drinks 1-2 glasses of wine per day    Prior to Admission Medications:      Prior to Admission medications    Not on File          VITAL SIGNS  Vital Signs Summary (Last Recorded)  BP (!) 160/105  Pulse 57  Temp 36 C (96.8 F)  Resp 16  Ht 1.626 m ( )  Wt 71.2 kg (157 lb)  SpO2 98%  BMI 26.95 kg/m2      PHYSICAL EXAM:  General Appearance: healthy, alert, no distress, pleasant affect, cooperative, skin warm, dry, and pink, cooperative.   HEENT: oral mucosa moist, No JVD, No cervical adenopathy, no [facility] rigidity  Heart: normal rate and regular rhythm, no murmurs,  clicks, or gallops.   Lungs: clear to auscultation.   Abdomen: soft and nontender .   Extremities: bilateral lower extremity no edema   Skin: no new rash .    Neuro: no focal deficit .   Mental Status: Appearance/Cooperation: oriented times 3.  Musculoskeletal: OA changes       LAB TESTS/STUDIES:    EKG does not show any acute ischemic changes normal sinus rhythm.  Chest x-ray is normal    Results for orders placed or performed during the hospital encounter of 10/25/16   CBC WITH DIFFERENTIAL   Result Value Ref Range    White Blood Cell Count 6.3 4.2 - 10.8 10*3/uL    Red Blood Cell Count 4.80 3.80 - 5.10 10*6/uL    Hemoglobin 14.5 12.0 - 16.0 g/dL    Hematocrit 60.4 54.0 - 48.0 %    MCV 87.7 82.0 - 97.0 fL    MCH 30.2 27.0 - 34.0 pg    MCHC 34.4 33.0 - 37.0 g/dL    RDW 98.1 19.1 - 47.8 %    MPV 10.0 9.4 - 12.4 fL    Platelet Count 224 151 - 365 10*3/uL    Neutrophils % Auto 56.1 45.0 - 75.0 %    Lymphocytes % Auto 31.0 5.0 - 41.0 %     Monocytes % Auto 6.2 0.0 - 10.0 %    Eosinophil % Auto 5.4 (H) 0.0 - 4.0 %    Basophils % Auto 1.0 0.0 - 1.0 %    Immature Granulocytes % 0.30 0.00 - 0.50 %    Neutrophil Abs Auto 3.51 2.20 - 4.80 10*3/uL    Lymphocyte Abs Auto 1.9 1.3 - 2.9 10*3/uL    Monocytes Abs Auto 0.4 0.3 - 0.8 10*3/uL    Eosinophil Abs Auto 0.3 (H) 0.0 - 0.2 10*3/uL    Basophils Abs Auto 0.1 0.0 - 0.1 10*3/uL    Nucleated RBC % Auto 0.0 <=0.0 % WBC    NRBC Abs Auto 0.0 0.0 - 0.1 10*3/uL    Immature Granulocytes Abs Auto 0.0 0.0 - 0.0 10*3/uL   COMPREHENSIVE METABOLIC PANEL   Result Value Ref Range    Sodium 139 134 - 143 mmol/L    Potassium 4.1 3.5 - 5.2 mmol/L    Chloride 104 99 - 109 mmol/L    Carbon Dioxide Total 29 22 - 32 mmol/L    Urea Nitrogen, Blood (BUN) 12 6 - 21 mg/dL    Glucose 99 70 - 99 mg/dL    Calcium 9.3 8.7 - 29.5 mg/dL    Protein 7.5 5.9 - 8.2 g/dL    Albumin 4.2 3.2 - 4.7 g/dL    Alkaline Phosphatase (ALP) 83 38 - 126 U/L    Aspartate Transaminase (AST) 27 9 - 44 U/L    Bilirubin Total 0.7 0.1 - 2.2 mg/dL    Alanine Transferase (ALT) 36 4 - 56 U/L    Creatinine Serum 0.81 0.50 - 1.30 mg/dL    Globulin 3.3 2.2 - 4.2 g/dL    Alb/Glob Ratio 1.3 1.0 - 1.6    BUN/ Creatinine 14.8 7.3 - 21.7    E-GFR 73 >60 mL/min/1.59m*2    E-GFR, Non-African American 73 >60 mL/min/1.62m*2   TROPONIN I   Result Value Ref Range    Troponin [facility] <0.01 <=0.03 ng/mL   ELECTROCARDIOGRAM WITH RHYTHM STRIP   Result Value Ref Range    QTC 442 ms         ASSESSMENT AND PLAN:  1.  Atypical chest pain, with some risk factor of hypertension, hyperlipidemia, some smoking  2.  Hypertension  3.  Hyperlipidemia  4.  Active smoking although significantly cut back    We will admit the patient to telemetry floor.  We will do serial cardiac enzymes 2D echo fasting lipid panel.  Will add the patient's on daily aspirin and continue to follow clinically.  Given the risk factors he would benefit from a stress test.  Given her age and ability to perform exercise  stress test that would be preferred choice at this point.  Order has been written to have exercise treadmill stress test tomorrow.    DVT Prophylaxis: SCD    Code status: Full code

## 2016-10-25 NOTE — ED Nursing Note (Signed)
[facility] bedside

## 2016-10-25 NOTE — Plan of Care (Signed)
Problem: Patient Care Overview (Adult)  Goal: Plan of Care Review  Outcome: Ongoing (interventions implemented as appropriate)   10/25/16 1822   OTHER   Plan Of Care Reviewed With patient   Plan of Care Review   Progress no change     Goal: Individualization and Mutuality  Outcome: Ongoing (interventions implemented as appropriate)    Goal: Discharge Needs Assessment  Outcome: Ongoing (interventions implemented as appropriate)   10/25/16 1822   Current Health   Outpatient/Agency/Support Group Needs none   Anticipated Changes Related to Illness none   Activity/Self Care Review of Systems   Equipment Currently Used at Home none   Living Environment   Transportation Available none       Problem: Pain, Acute (Adult)  Goal: Acceptable Pain Control/Comfort Level  Patient will demonstrate the desired outcomes by discharge/transition of care.  Outcome: Ongoing (interventions implemented as appropriate)   10/25/16 1822   Pain, Acute (Adult)   Acceptable Pain Control/Comfort Level making progress toward outcome

## 2016-10-25 NOTE — ED Nursing Note (Signed)
Pt transferred to N2 on gurney, on monitor, by my self, RN care handed off.

## 2016-10-25 NOTE — ED Initial Note (Signed)
Chief Complaint   Patient presents with    Chest Pain, Active Cardiac Features       HPI: Alice Baker is a 56yr female who presents with chest pain.  Patient states she has had chest pain intermittently over the past few weeks but it is increased today and radiating to her left side.  Patient describes her chest pain as a heaviness.  Patient went to her primary care physician previously and it was thought to be costochondritis.  Patient states she came to the lab to have a blood draw done but was referred to the emergency department.    Quality: heaviness  Location: chest  Severity: 6/10  Provoking Factors: none  Relieving Factors: none    Past Medical History:   Diagnosis Date    COPD (chronic obstructive pulmonary disease) (HCC)     Eczema 1962       No past surgical history on file.    No family history on file.    Social History     Social History    Marital status: WIDOWED     Spouse name: N/A    Number of children: N/A    Years of education: N/A     Occupational History    Not on file.     Social History Main Topics    Smoking status: Current Every Day Smoker     Packs/day: 0.20     Years: 40.00     Types: Cigarettes    Smokeless tobacco: Never Used      Comment: pt smokes 1-2 cigarettes a day    Alcohol use 4.2 oz/week     7 Glasses of wine per week      Comment: one 8 ounce glass of wine with dinner    Drug use: 1.00 per week     Special: Marijuana    Sexual activity: Not on file     Other Topics Concern    Not on file     Social History Narrative    No narrative on file       Allergies:   Anaprox [Naproxen Sodium]    Chest Pain    Medication:   Current Facility-Administered Medications:     Acetaminophen (TYLENOL) Tablet 325 mg, 325 mg, ORAL, Q4H PRN, Gaynell R Jenne, RPh    [START ON 10/26/2016] Aspirin EC Tablet 81 mg, 81 mg, ORAL, QAM, Prakash Dobaria    Bisacodyl (DULCOLAX) Suppository 10 mg, 10 mg, RECTALLY,  Q24H PRN, Joslyn Hy Dobaria    Docusate Calcium (COLACE) Capsule 240 mg, 240 mg, ORAL, QAM, Prakash Dobaria    Hydrocodone 5 mg/Acetaminophen 325 mg (NORCO  5) Tablet 1 tablet, 1 tablet, ORAL, Q4H PRN, Gaynell R Jenne, RPh    Influenza Vaccine, Quadrivalent (FLULAVAL/FLUARIX) Injection 0.5 mL, 0.5 mL, IM, WHEN APPROPRIATE, Prakash Dobaria    Lidocaine (XYLOCAINE) 10 mg/mL (1%) Injection 0.2 mL, 0.2 mL, INTRADERMAL, PRN, Ala Bent    Magnesium Hydroxide (MILK OF MAGNESIA) 400 mg/5 mL Suspension 30 mL, 30 mL, ORAL, Q24H PRN, Ala Bent    Melatonin Tablet 3 mg, 3 mg, ORAL, Bedtime PRN, Ala Bent    Morphine Injection 3 mg, 3 mg, IV, Q30MIN PRN, Gaynell R Jenne, RPh    Naloxone (NARCAN) Injection 0.08 mg, 0.08 mg, IV, Q2MIN PRN, Joslyn Hy Dobaria    Naloxone (NARCAN) Injection 0.2 mg, 0.2 mg, IV, PRN, Ala Bent    Nitroglycerin (NITROSTAT) Sublingual Tablet 0.4 mg, 0.4 mg, SUBLINGUAL, Q5MIN PRN, Ala Bent  Nitroglycerin (NITROSTAT) Sublingual Tablet 0.4 mg, 0.4 mg, SUBLINGUAL, Q5MIN, Carmelina Noun, MD    Ondansetron (ZOFRAN) Injection 4 mg, 4 mg, IV, Q8H PRN, Ala Bent    Promethazine (PHENERGAN) Injection 6.25 mg, 6.25 mg, IV, Q4H PRN, Gaynell R Jenne, RPh    Saline Lock Flush 2.5 mL, 2.5 mL, IV, Q8H (16,10,96), Ala Bent, 2.5 mL at 10/25/16 1707    Saline Lock Flush 2.5 mL, 2.5 mL, IV, PRN, Joslyn Hy Dobaria    ROS:  Review of Systems   Constitutional: Negative for activity change, chills, fatigue, fever and unexpected weight change.   HENT: Negative for congestion, dental problem, ear pain, facial swelling, mouth sores, postnasal drip, rhinorrhea, sinus pressure, sore throat and trouble swallowing.    Eyes: Negative for photophobia, pain, discharge, redness and visual disturbance.   Respiratory: Positive for chest tightness. Negative for cough, shortness of breath and wheezing.     Cardiovascular: Positive for chest pain. Negative for palpitations and leg swelling.   Gastrointestinal: Negative for abdominal pain, diarrhea, nausea and vomiting.   Endocrine: Negative for polydipsia and polyuria.   Genitourinary: Negative for decreased urine volume, dysuria, flank pain and hematuria.   Musculoskeletal: Negative for back pain, joint swelling, myalgias and neck pain.   Skin: Negative for rash.   Allergic/Immunologic: Negative for environmental allergies and food allergies.   Neurological: Negative for light-headedness, numbness and headaches.   Hematological: Negative for adenopathy.   Psychiatric/Behavioral: Negative for behavioral problems, confusion, hallucinations and self-injury. The patient is not nervous/anxious.        PE:  Physical Exam   Constitutional: She is oriented to person, place, and time. She appears well-developed and well-nourished.   HENT:   Head: Normocephalic and atraumatic.   Right Ear: External ear normal.   Left Ear: External ear normal.   Nose: Nose normal.   Mouth/Throat: Oropharynx is clear and moist. No oropharyngeal exudate.   Eyes: Conjunctivae and EOM are normal. Pupils are equal, round, and reactive to light. Left eye exhibits no discharge.   Neck: Normal range of motion. Neck supple.   Cardiovascular: Normal rate, regular rhythm, normal heart sounds and intact distal pulses.    Pulmonary/Chest: Effort normal and breath sounds normal. No respiratory distress. She has no wheezes. She exhibits tenderness.   Abdominal: Soft. Bowel sounds are normal. There is no tenderness.   Musculoskeletal: Normal range of motion. She exhibits no edema.   Neurological: She is alert and oriented to person, place, and time. She has normal reflexes.   Skin: Skin is warm and dry.   Psychiatric: She has a normal mood and affect. Her behavior is normal. Judgment and thought content normal.   Nursing note and vitals  reviewed.      Temp: 36 C (96.8 F) (10/12 1139)  Temp src: Tympanic (10/12 1538)  Pulse: 58 (10/12 1538)  BP: 159/96 (10/12 1538)  Resp: 18 (10/12 1538)  SpO2: 98 % (10/12 1538)  Height: 162.6 cm ( ) (10/12 1538)  Weight: 70.5 kg (155 lb 6.8 oz) (10/12 1538)    Differentials: This differential diagnosis is broad and complex, and includes, but is not limited to STEMI, NSTEMI, unstable angina, aortic dissection, pericardial tamponade, pericarditis, myocarditis, pulmonary embolism, tension pneumothorax, pneumonia, mediastinitis, gastroesophageal reflux disease, costochondritis, musculoskeletal pain, anxiety, panic attack.xc pH    Labs:   Results for orders placed or performed during the hospital encounter of 10/25/16   CBC WITH DIFFERENTIAL     Status: Abnormal   Result Value Status  White Blood Cell Count 6.3 Final    Red Blood Cell Count 4.80 Final    Hemoglobin 14.5 Final    Hematocrit 42.1 Final    MCV 87.7 Final    MCH 30.2 Final    MCHC 34.4 Final    RDW 13.2 Final    MPV 10.0 Final    Platelet Count 224 Final    Neutrophils % Auto 56.1 Final    Lymphocytes % Auto 31.0 Final    Monocytes % Auto 6.2 Final    Eosinophil % Auto 5.4 (H) Final    Basophils % Auto 1.0 Final    Immature Granulocytes % 0.30 Final    Neutrophil Abs Auto 3.51 Final    Lymphocyte Abs Auto 1.9 Final    Monocytes Abs Auto 0.4 Final    Eosinophil Abs Auto 0.3 (H) Final    Basophils Abs Auto 0.1 Final    Nucleated RBC % Auto 0.0 Final    NRBC Abs Auto 0.0 Final    Immature Granulocytes Abs Auto 0.0 Final   COMPREHENSIVE METABOLIC PANEL     Status: Normal   Result Value Status    Sodium 139 Final    Potassium 4.1 Final    Chloride 104 Final    Carbon Dioxide Total 29 Final    Urea Nitrogen, Blood (BUN) 12 Final    Glucose 99 Final    Calcium 9.3 Final    Protein 7.5 Final    Albumin 4.2 Final    Alkaline Phosphatase (ALP) 83 Final    Aspartate Transaminase (AST) 27  Final    Bilirubin Total 0.7 Final    Alanine Transferase (ALT) 36 Final    Creatinine Serum 0.81 Final    Globulin 3.3 Final    Alb/Glob Ratio 1.3 Final    BUN/ Creatinine 14.8 Final    E-GFR 73 Final    E-GFR, Non-African American 73 Final   TROPONIN I     Status: Normal   Result Value Status    Troponin IM[facility] <0.01 Final   TROPONIN I     Status: Normal   Result Value Status    Troponin IMMC <0.01 Final   TSH WITH FREE T4 REFLEX     Status: Normal   Result Value Status    Thyroid Stimulating Hormone 1.74 Final    Thyroxine, Free (Free T4) 1.0 Final   CK-MB PANEL (CK-ISO)     Status: Normal   Result Value Status    CK-MB 1.6 Final    Relative Index 2.1 Final    Creatine Kinase, Total 75 Final       Radiology:Dx Chest 1 View    Result Date: 10/25/2016  Technique: Portable AP chest. INDICATIONS: Chest pain. Comparison: None. Findings: The lung fields are clear. The heart size and pulmonary vasculature are within normal limits. Mediastinal and hilar contours are also essentially normal. There is no pleural effusion or pneumothorax. Soft tissue silhouettes and visualized osseous structures are unremarkable for age.     Impression: No evidence of acute cardiopulmonary disease process.      Orders Placed This Encounter    DX CHEST 1 VIEW    CBC WITH DIFFERENTIAL    COMPREHENSIVE METABOLIC PANEL    TROPONIN I    TSH WITH FREE T4 REFLEX    CK-MB PANEL (CK-ISO)    CBC WITH DIFFERENTIAL    BASIC METABOLIC PANEL    LIPID PANEL    DISCONTD: Saline Lock Flush 2.5 mL  DISCONTD: Saline Lock Flush 2.5 mL    Aspirin Chewable Tablet 162 mg    Saline Lock Flush 2.5 mL    Saline Lock Flush 2.5 mL    Lidocaine (XYLOCAINE) 10 mg/mL (1%) Injection 0.2 mL    Nitroglycerin (NITROSTAT) Sublingual Tablet 0.4 mg    Naloxone (NARCAN) Injection 0.08 mg    Naloxone (NARCAN) Injection 0.2 mg    Docusate Calcium (COLACE) Capsule 240 mg    Magnesium Hydroxide (MILK OF MAGNESIA) 400 mg/5 mL Suspension 30 mL    Bisacodyl  (DULCOLAX) Suppository 10 mg    Aspirin EC Tablet 81 mg    DISCONTD: Acetaminophen (TYLENOL) Tablet 325-650 mg    DISCONTD: Hydrocodone 5 mg/Acetaminophen 325 mg (NORCO  5) Tablet 1-2 tablet    DISCONTD: Morphine Injection 3-4 mg    Melatonin Tablet 3 mg    Ondansetron (ZOFRAN) Injection 4 mg    DISCONTD: Promethazine (PHENERGAN) Injection 6.25-12.5 mg    Alum-Mag Hydroxide-Simeth (MAALOX) 200-200-20 mg/5 mL Suspension 30 mL    Lidocaine (XYLOCAINE) 2 % Viscous Solution 10 mL    Nitroglycerin (NITROSTAT) Sublingual Tablet 0.4 mg    Acetaminophen (TYLENOL) Tablet 325 mg    Hydrocodone 5 mg/Acetaminophen 325 mg (NORCO  5) Tablet 1 tablet    Morphine Injection 3 mg    Promethazine (PHENERGAN) Injection 6.25 mg    Amlodipine (NORVASC) 5 mg Tablet    Atorvastatin (LIPITOR) 40 mg tablet    Pneumococcal Vac Polyvalent (PNEUMOVAX 23) Syringe 0.5 mL    Influenza Vaccine, Quadrivalent (FLULAVAL/FLUARIX) Injection 0.5 mL       MDM  Reviewed: previous chart, nursing note and vitals        ED Summary: There is a 56 year old female presents emergency department today complaining of intermittent chest pain over the last few weeks, increased today and rating to the left side of her chest to the axilla.  Patient states she saw her primary care physician for this previously and was told it was costochondritis, however with her change in symptoms today she was referred to the lab for a troponin test.  The lab referred the patient here to the emergency department.  Patient denies any shortness of breath associated with this, she denies any leg swelling.  Patient is tender across her chest wall.  Her troponin is negative and her EKG shows sinus rhythm with sinus arrhythmia but without any acute T wave changes.  Since the patient's chest pain is worsening, she is hypertensive with risk factors of hyperlipidemia hypertension and smoking, she will be admitted to the hospital for further evaluation and stress  test.        Clinical Impression:     ICD-10-CM    1. Chest pain, unspecified type R07.9 ADMIT TO INPATIENT     ADMIT TO INPATIENT       PLAN: Patient will be admitted for rule out and stress test. The patient participated in the decision making process, verbalized understanding, and no barriers to learning identified.      Disposition: Admit    APP DOCUMENTATION:  This patient was also seen and evaluated by Dr. Virginia Rochester.    PATIENT'S GENERAL CONDITION:  Good: Vital signs are stable and within normal limits. Patient is conscious and comfortable. Indicators are excellent.     .     This chart was electronically signed by Alfonse Spruce, PA-C

## 2016-10-25 NOTE — ED Nursing Note (Signed)
Report to [facility] via phone

## 2016-10-25 NOTE — ED Nursing Note (Signed)
Called [place] [facility] to give report.

## 2016-10-26 ENCOUNTER — Inpatient Hospital Stay (EMERGENCY_DEPARTMENT_HOSPITAL)
Admission: RE | Admit: 2016-10-26 | Discharge: 2016-10-26 | Disposition: A | Discharge status: Home / Self Care | Payer: MEDICAID | Source: Ambulatory Visit | Attending: Internal Medicine | Admitting: Internal Medicine

## 2016-10-26 ENCOUNTER — Inpatient Hospital Stay: Payer: MEDICAID

## 2016-10-26 ENCOUNTER — Inpatient Hospital Stay (HOSPITAL_COMMUNITY)
Admission: RE | Admit: 2016-10-26 | Discharge: 2016-10-26 | Disposition: A | Discharge status: Home / Self Care | Payer: MEDICAID | Source: Ambulatory Visit | Attending: Internal Medicine | Admitting: Internal Medicine

## 2016-10-26 DIAGNOSIS — R0789 Other chest pain: Secondary | ICD-10-CM

## 2016-10-26 LAB — LIPID PANEL
CHOL HDL RATIO MMC: 2.9 mg/dL (ref 2.0–5.0)
CHOLESTEROL: 214 mg/dL — AB (ref 112–200)
HDL CHOLESTEROL: 73 mg/dL (ref 40–?)
LDL CHOLESTEROL CALCULATION: 121 mg/dL — AB (ref <100)
NON-HDL CHOLESTEROL: 141 mg/dL (ref <=150.0)
TRIGLYCERIDE: 100 mg/dL (ref 30–150)

## 2016-10-26 LAB — CBC WITH DIFFERENTIAL
BASOPHILS % AUTO: 0.8 % (ref 0.0–1.0)
BASOPHILS ABS AUTO: 0.1 10*3/uL (ref 0.0–0.1)
EOSINOPHIL % AUTO: 6 % — AB (ref 0.0–4.0)
EOSINOPHIL ABS AUTO: 0.4 10*3/uL — AB (ref 0.0–0.2)
HEMATOCRIT: 39.1 % (ref 36.0–48.0)
HEMOGLOBIN: 13.4 g/dL (ref 12.0–16.0)
IMMATURE GRANULOCYTES % AUTO: 0.3 % (ref 0.00–0.50)
IMMATURE GRANULOCYTES ABS AUTO: 0 10*3/uL (ref 0.0–0.0)
LYMPHOCYTE ABS AUTO: 1.8 10*3/uL (ref 1.3–2.9)
LYMPHOCYTES % AUTO: 28.7 % (ref 5.0–41.0)
MCH: 30.2 pg (ref 27.0–34.0)
MCHC G/DL: 34.3 g/dL (ref 33.0–37.0)
MCV: 88.1 fL (ref 82.0–97.0)
MONOCYTES % AUTO: 8.4 % (ref 0.0–10.0)
MONOCYTES ABS AUTO: 0.5 10*3/uL (ref 0.3–0.8)
MPV: 10.5 fL (ref 9.4–12.4)
NEUTROPHIL ABS AUTO: 3.51 10*3/uL (ref 2.20–4.80)
NEUTROPHILS % AUTO: 55.8 % (ref 45.0–75.0)
NUCLEATED CELL COUNT: 0 10*3/uL (ref 0.0–0.1)
NUCLEATED RBC/100 WBC: 0 %{WBCs} (ref <=0.0)
PLATELET COUNT: 181 10*3/uL (ref 151–365)
RDW: 13.1 % (ref 11.5–14.5)
RED CELL COUNT: 4.44 10*6/uL (ref 3.80–5.10)
WHITE BLOOD CELL COUNT: 6.3 10*3/uL (ref 4.2–10.8)

## 2016-10-26 LAB — BASIC METABOLIC PANEL
BUN/CREATININE_MMC: 18.5 (ref 7.3–21.7)
CALCIUM: 8.7 mg/dL (ref 8.7–10.2)
CARBON DIOXIDE TOTAL: 29 mmol/L (ref 22–32)
CHLORIDE: 106 mmol/L (ref 99–109)
CREATININE BLOOD: 0.81 mg/dL (ref 0.50–1.30)
E-GFR, NON-AFRICAN AMERICAN: 73 mL/min/{1.73_m2} (ref 60–?)
E-GFR_MMC: 73 mL/min/{1.73_m2} (ref 60–?)
GLUCOSE: 93 mg/dL (ref 70–99)
POTASSIUM: 4 mmol/L (ref 3.5–5.2)
SODIUM: 139 mmol/L (ref 134–143)
UREA NITROGEN, BLOOD (BUN): 15 mg/dL (ref 6–21)

## 2016-10-26 MED ORDER — LORAZEPAM 2 MG/ML INJECTION SOLUTION
INTRAMUSCULAR | Status: AC
Start: 2016-10-26 — End: 2016-10-26
  Filled 2016-10-26: qty 1

## 2016-10-26 MED ORDER — LORAZEPAM 2 MG/ML INJECTION SOLUTION
1.00 mg | Freq: Once | INTRAMUSCULAR | Status: AC
Start: 2016-10-26 — End: 2016-10-26
  Administered 2016-10-26: 1 mg via INTRAVENOUS

## 2016-10-26 MED ORDER — PANTOPRAZOLE 40 MG TABLET,DELAYED RELEASE
40.00 mg | DELAYED_RELEASE_TABLET | Freq: Every day | ORAL | 11 refills | Status: AC
Start: 2016-10-26 — End: 2017-10-21

## 2016-10-26 NOTE — Nurse Discharge Note (Signed)
Vss. No pain . Went over d/c instructions with pt all questions and concerns addressed..Unless otherwise noted, the patient's status remained unchanged during shift. All planned interventions and routine care were completed during the shift, and the patient/family received teaching in the language preferred with sufficient comprehension.

## 2016-10-26 NOTE — Procedures (Signed)
EXERCISE NUCLEAR STRESS   PATIENT NAME:Alice Baker, Alice Baker          ADMIT DATE: 10/25/2016  DOB:[birth year]                          DISCHARGE DATE: 10/26/2016  AGE:56                                  DATE OF VISIT:10/26/2016  REFERRING PHYSICIAN:  ,      STUDY:    Exercise nuclear stress test.     The patient exercised on a treadmill 3-minute Bruce protocol for a total of 9 minutes/56 seconds, achieving total METS of 13.  Test was stopped due to fatigue.     Resting heart rate was 67, rose to a maximum heart rate of 130, which is 79% of maximal age-predicted heart rate.  Resting blood pressure was 121/77, rose to maximum blood pressure of 170/80.     The patient did have some chest pain and shortness of breath during stress test, which resolved in recovery.     EKG shows normal sinus rhythm.  No ST/T wave changes.  The patient did not have any ischemic changes, ST changes, arrhythmias, or AV blocks with stress.    CONCLUSIONS:    1. Appropriate hemodynamic response to stress.  The patient did have a drop in blood pressure to 90 systolics for a transient period of time, but recovered back to her resting blood pressure.   2. No EKG changes consistent with ischemia.   3. Shortness of breath and chest pain during stress test, which may be anginal   equivalent.   4. Images will be reported and interpreted by Radiology.        Electronically Signed by Danella Sensing. Altamease Oiler, MD on 10/30/2016 13:14:01  Danella Sensing. Altamease Oiler, MD      DIFabiola Backer  DD: 10/26/2016 18:59:04           JOB: [id number]  DT: 10/26/2016 19:33:57  DICT ID [id number]  MT: IB  CATHERINE ELIZABETH MOIZEAU    Danella Sensing. Altamease Oiler, MD              PATIENT NAMEMarland Kitchen Baker, Alice Baker                                      [id number]                                      CSN-VISIT ID: [id number]                                      DATE: 10/26/2016                                      PATIENT ROOM #: N215                                      EXERCISE NUCLEAR STRESS  Page  2 of 2

## 2016-10-26 NOTE — Allied Health Progress (Addendum)
CASE MANAGEMENT INTIAL PATIENT SCREEN     Patient: Natlie Asfour                                    MRN: [id number]  Date of Birth: 01/07/1961 (56yr)                       Gender: female  PCP: Grace Bushy, MD                              Date of Initial Screen:  10/26/2016 @ 11:12    Type of Contact: face to face    Date of Admission: 10/25/2016    Recent ED or Admission Dates:  none          Insurance Status: Payor: BLUE [employer]OSS [employer] / Plan: BLUE CROSS/[employer] / Product Type: *No Product type* /     Chief Complaint:  Chest Pain, Active Cardiac Features      Admitting Diagnosis:   Chest pain, unspecified type [R07.9]     Co-Morbidities relevant to current admission: see H&P    Consults:   None    Procedures:     Social Determinants: Lives with roommate    Home Care active:        Agency and services:    Readmission Risk Score: Risk of Admission or ED Visit: 15    1 - 19 Green   20 - 39 Yellow   > 40 Red      Case Management Services:    Complete Comprehensive Assessment    Chart reviewed unable to see face to face was at her stress test.    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  Ind and drives.  No needs anticipated, CM will not follow unless referred.    Benancio Deeds, RN  10/26/2016 @ 11:12

## 2016-10-26 NOTE — Nurse Focus (Signed)
End of Shift Statement      Patient Progress per diagnosis: a/ox4, VSS, SB. NPO since MN for stress test. Pt reported mild -moderate pain to left upper chest and between shoulders. Relieved with Norco. Pt states that pain "does not feel like cardiac pain" that she has had in the past. States pain increases with "movement". Hx of cardiac cath 7 yrs ago. Ad lib. Slept most of shift.     Significant events: SB while sleeping. HR 45-50. Asymptomatic. Denies palpitations, dizziness, SOB. BP= 114/69.    Patients pain level assessed during shift and interventions for increased comfort were implemented as needed.   Interventions were effective  Patient was able to participate in ADL's  and rest comfortably  Side Effects: none  Education provided on medication norco    Sharl Ma, RN

## 2016-10-26 NOTE — Discharge Summary (Signed)
DISCHARGE SUMMARY  Date of Admission:   10/25/2016 1205 Date of Discharge    Admitting  Service: Hospitalist Discharging Service: (A) [facility] Hospitalist    Attending Physician at time of Discharge: Augustina Mood, MD          Discharge Diagnosis:   Patient Active Hospital Problem List:  Chest pain, unspecified type (10/25/2016)    Assessment: Noncardiac, possibly GI, possibly musculoskeletal    Plan: Cardiolite stress test negative, echo normal   hypertension (10/25/2016)    Assessment: Essential    Plan: Well-controlled         Medications at time of Discharge:     Medication List      START taking these medications          Pantoprazole 40 mg Delayed Release Tablet   Commonly known as:  PROTONIX   Take 1 tablet by mouth every morning before a meal.         CONTINUE taking these medications          Amlodipine 5 mg Tablet   Commonly known as:  NORVASC       LIPITOR 40 mg tablet   Generic drug:  Atorvastatin            Where to Get Your Medications      These medications were sent to [facility] [address] - [place], [place] - [address], [phone] [facility] [phone] FX  [address], [place] [address]     Phone:  [phone]     Pantoprazole 40 mg Delayed Release Tablet               Procedure(s) Performed:   1.echocardiogram  2.nuclear medicine stress test   Consultation(s):   None    Reason for Admission and Brief HPI:   Chest pain to the H&P dictated by hospitalist Dr. Meryl Crutch Course:   Is a 56 year old female with history of hypertension, hyperlipidemia ongoing smoker down to 1 cigarette a day ,came in with a few weeks history of chest pain midsternal at times exertional at times, coming on at rest with no associated nausea or vomiting, apnea, PND , swelling of legs, cough, hemoptysis, palpitations, cold sweats or dizziness.  Patient has tried ibuprofen outpatient basis which seems to help at times and does not help at times, because of her chest pain she was referred  for admission to rule out cardiac  Disease.  Workup is unremarkable echocardiogram is normal, nuclear medicine pharmacological stress test is negative for ischemia, her chest x-ray is normal, cardiac enzymes CBC comprehensive metabolic profile are not all normal.  I had a long discussion with the patient is could be possibly musculoskeletal, possibly gallbladder or possibly acid reflux or esophageal spasm ,will be tried on Protonix 40 mg once a day.  If she is not improved in the next few days I recommend doing a CT scan of the chest with contrast.         Vital Signs:  Current Vitals  Temp: 36.1 C (96.9 F)  BP: 129/88  Pulse: (!) 49  Resp: 18  SpO2: 98 %      Weight: 71.9 kg (158 lb 8.2 oz)     Physical Exam:  HEENT-within normal limits  Neck-Supple, no thyromegaly, no lymphadenopathy  Cardiovascular exam-S1-S2 regular rate rhythm, no murmur, no rubs, no gallop  Abdomen-soft, nontender to palpation, no hepatosplenomegaly  Extremities-no cyanosis, no clubbing, no edema  Neuro-no gross neurological deficits  Pertinent  labs and x-rays   CBC is normal, comprehensive metabolic profile is normal, cardiac enzymes are normal,, EKG normal  Echocardiogram within normal limits  Nuclear medicine stress test-discussed with Dr. Florestine Avers negative for ischemia infarct wall, motion abnormalities    CONDITION AT DISCHARGE   Condition at Discharge: Stable                  Discharge Instructions:    DISCHARGE DIET   Discharge Diet: Cardiac (3 gms Sodium)      DISCHARGE ACTIVITY RESTRICTIONS   Discharge Activity Restrictions: No restriction                Recommended Follow Up Appointments:  Grace Bushy, MD  [address]  [place] [address]  [phone]    In 3 days         Scheduled Appointments:  No future appointments.         Total time spent on discharge 30 minutes .    Kindle Strohmeier Burney Gauze, MD, MD

## 2016-10-26 NOTE — Plan of Care (Signed)
Problem: Patient Care Overview (Adult)  Goal: Plan of Care Review  Outcome: Ongoing (interventions implemented as appropriate)    Goal: Individualization and Mutuality  Outcome: Ongoing (interventions implemented as appropriate)    Goal: Discharge Needs Assessment  Outcome: Ongoing (interventions implemented as appropriate)      Problem: Sleep Pattern Disturbance (Adult)  Goal: Identify Related Risk Factors and Signs and Symptoms  Related risk factors and signs and symptoms are identified upon initiation of Human Response Clinical Practice Guideline (CPG)   Outcome: Ongoing (interventions implemented as appropriate)    Goal: Adequate Sleep/Rest  Patient will demonstrate the desired outcomes by discharge/transition of care.   Outcome: Ongoing (interventions implemented as appropriate)      Problem: Pain, Acute (Adult)  Goal: Identify Related Risk Factors and Signs and Symptoms  Related risk factors and signs and symptoms are identified upon initiation of Human Response Clinical Practice Guideline (CPG)   Outcome: Ongoing (interventions implemented as appropriate)    Goal: Acceptable Pain Control/Comfort Level  Patient will demonstrate the desired outcomes by discharge/transition of care.   Outcome: Ongoing (interventions implemented as appropriate)      Problem: Acute Coronary Syndrome (ACS) (Adult)  Goal: Signs and Symptoms of Listed Potential Problems Will be Absent or Manageable (Acute Coronary Syndrome)  Signs and symptoms of listed potential problems will be absent or manageable by discharge/transition of care (reference Acute Coronary Syndrome (ACS) (Adult) CPG).  Outcome: Ongoing (interventions implemented as appropriate)

## 2016-10-26 NOTE — Plan of Care (Signed)
Problem: Patient Care Overview (Adult)  Goal: Plan of Care Review  Outcome: Ongoing (interventions implemented as appropriate)   10/26/16 1306   OTHER   Plan Of Care Reviewed With patient   Plan of Care Review   Progress improving     Goal: Individualization and Mutuality  Outcome: Ongoing (interventions implemented as appropriate)    Goal: Discharge Needs Assessment  Outcome: Ongoing (interventions implemented as appropriate)   10/26/16 1306   Current Health   Outpatient/Agency/Support Group Needs none   Anticipated Changes Related to Illness none   Activity/Self Care Review of Systems   Equipment Currently Used at Home none   Living Environment   Transportation Available none       Problem: Sleep Pattern Disturbance (Adult)  Goal: Identify Related Risk Factors and Signs and Symptoms  Related risk factors and signs and symptoms are identified upon initiation of Human Response Clinical Practice Guideline (CPG)   Outcome: Ongoing (interventions implemented as appropriate)   10/26/16 1306   Sleep Pattern Disturbance   Sleep Pattern Disturbance: Related Risk Factors unfamiliar surroundings     Goal: Adequate Sleep/Rest  Patient will demonstrate the desired outcomes by discharge/transition of care.   Outcome: Ongoing (interventions implemented as appropriate)   10/26/16 1306   Sleep Pattern Disturbance (Adult)   Adequate Sleep/Rest making progress toward outcome       Problem: Pain, Acute (Adult)  Goal: Identify Related Risk Factors and Signs and Symptoms  Related risk factors and signs and symptoms are identified upon initiation of Human Response Clinical Practice Guideline (CPG)   Outcome: Ongoing (interventions implemented as appropriate)   10/26/16 1306   Pain, Acute   Related Risk Factors (Acute Pain) (chest pain)   Signs and Symptoms (Acute Pain) verbalization of pain descriptors     Goal: Acceptable Pain Control/Comfort Level  Patient will demonstrate the desired outcomes by discharge/transition of care.    Outcome: Ongoing (interventions implemented as appropriate)   10/26/16 1306   Pain, Acute (Adult)   Acceptable Pain Control/Comfort Level achieves outcome       Problem: Acute Coronary Syndrome (ACS) (Adult)  Goal: Signs and Symptoms of Listed Potential Problems Will be Absent or Manageable (Acute Coronary Syndrome)  Signs and symptoms of listed potential problems will be absent or manageable by discharge/transition of care (reference Acute Coronary Syndrome (ACS) (Adult) CPG).   Outcome: Ongoing (interventions implemented as appropriate)   10/26/16 1306   Acute Coronary Syndrome (ACS)   Problems Assessed (Acute Coronary Syndrome (ACS)) chest pain (angina)   Problems Present (Acute Coronary Syndrome (ACS)) chest pain (angina)  (pt having stress test today)
